# Patient Record
Sex: Female | Born: 1995 | Hispanic: No | Marital: Married | Smoking: Never smoker
Health system: Southern US, Community
[De-identification: ages and names within clinical notes are randomized; demographics above are authoritative.]

---

## 2020-05-13 ENCOUNTER — Encounter (HOSPITAL_COMMUNITY): Payer: Self-pay

## 2020-05-13 ENCOUNTER — Other Ambulatory Visit: Payer: Self-pay

## 2020-05-13 ENCOUNTER — Ambulatory Visit (HOSPITAL_COMMUNITY)
Admission: EM | Admit: 2020-05-13 | Discharge: 2020-05-13 | Disposition: A | Discharge status: Home / Self Care | Payer: Self-pay | Attending: Family Medicine | Admitting: Family Medicine

## 2020-05-13 DIAGNOSIS — N912 Amenorrhea, unspecified: Secondary | ICD-10-CM

## 2020-05-13 DIAGNOSIS — Z3201 Encounter for pregnancy test, result positive: Secondary | ICD-10-CM

## 2020-05-13 DIAGNOSIS — R5383 Other fatigue: Secondary | ICD-10-CM

## 2020-05-13 DIAGNOSIS — Z331 Pregnant state, incidental: Secondary | ICD-10-CM

## 2020-05-13 LAB — POC URINE PREG, ED: Preg Test, Ur: POSITIVE — AB

## 2020-05-13 NOTE — ED Triage Notes (Signed)
Pt reports here for a pregnancy test. Reports feeling tired and missed her period last month.

## 2020-05-13 NOTE — ED Provider Notes (Signed)
[facility]    CSN: [id number] Arrival date & time: 05/13/20  1013      History   Chief Complaint Chief Complaint  Patient presents with  . Possible Pregnancy    Pt reports feeling tired and did not have a period last month.     HPI Mary Shepherd is a 24 y.o. female.   Reports that she has been feeling tired and a little nauseated for the last few days. Reports that she missed her period last month as well. Reports that she is sexually active and does not use birth control. Denies headache, vomiting, diarrhea, abdominal pain, cramping, vaginal bleeding, other symptoms.   ROS per HPI  The history is provided by the patient.  Possible Pregnancy    History reviewed. No pertinent past medical history.  There are no problems to display for this patient.   History reviewed. No pertinent surgical history.  OB History   No obstetric history on file.      Home Medications    Prior to Admission medications   Not on File    Family History History reviewed. No pertinent family history.  Social History Social History   Tobacco Use  . Smoking status: Never Smoker  . Smokeless tobacco: Never Used  Substance Use Topics  . Alcohol use: Never  . Drug use: Never     Allergies   Patient has no allergy information on record.   Review of Systems Review of Systems   Physical Exam Triage Vital Signs ED Triage Vitals  Enc Vitals Group     BP 05/13/20 1121 131/86     Pulse Rate 05/13/20 1121 87     Resp 05/13/20 1121 18     Temp 05/13/20 1121 97.7 F (36.5 C)     Temp Source 05/13/20 1121 Oral     SpO2 05/13/20 1121 99 %     Weight --      Height --      Head Circumference --      Peak Flow --      Pain Score 05/13/20 1106 0     Pain Loc --      Pain Edu? --      Excl. in GC? --    No data found.  Updated Vital Signs BP 131/86 (BP Location: Right Arm)   Pulse 87   Temp 97.7 F (36.5 C) (Oral)   Resp 18   LMP 03/26/2020   SpO2 99%    Visual Acuity Right Eye Distance:   Left Eye Distance:   Bilateral Distance:    Right Eye Near:   Left Eye Near:    Bilateral Near:     Physical Exam Vitals and nursing note reviewed.  Constitutional:      General: She is not in acute distress.    Appearance: Normal appearance. She is well-developed. She is not ill-appearing.  HENT:     Head: Normocephalic and atraumatic.     Nose: Nose normal.     Mouth/Throat:     Mouth: Mucous membranes are moist.     Pharynx: Oropharynx is clear.  Eyes:     Extraocular Movements: Extraocular movements intact.     Conjunctiva/sclera: Conjunctivae normal.     Pupils: Pupils are equal, round, and reactive to light.  Cardiovascular:     Rate and Rhythm: Normal rate and regular rhythm.     Heart sounds: Normal heart sounds. No murmur heard.   Pulmonary:     Effort:  Pulmonary effort is normal. No respiratory distress.     Breath sounds: Normal breath sounds. No stridor. No wheezing, rhonchi or rales.  Chest:     Chest wall: No tenderness.  Abdominal:     General: Bowel sounds are normal. There is no distension.     Palpations: Abdomen is soft. There is no mass.     Tenderness: There is no abdominal tenderness. There is no guarding or rebound.     Hernia: No hernia is present.  Musculoskeletal:        General: Normal range of motion.     Cervical back: Normal range of motion and neck supple.  Skin:    General: Skin is warm and dry.     Capillary Refill: Capillary refill takes less than 2 seconds.  Neurological:     General: No focal deficit present.     Mental Status: She is alert and oriented to person, place, and time.  Psychiatric:        Mood and Affect: Mood normal.        Behavior: Behavior normal.        Thought Content: Thought content normal.      UC Treatments / Results  Labs (all labs ordered are listed, but only abnormal results are displayed) Labs Reviewed  POC URINE PREG, ED - Abnormal; Notable for the  following components:      Result Value   Preg Test, Ur POSITIVE (*)    All other components within normal limits    EKG   Radiology No results found.  Procedures Procedures (including critical care time)  Medications Ordered in UC Medications - No data to display  Initial Impression / Assessment and Plan / UC Course  I have reviewed the triage vital signs and the nursing notes.  Pertinent labs & imaging results that were available during my care of the patient were reviewed by me and considered in my medical decision making (see chart for details).     Amenorrhea Fatigue Positive Pregnancy test Incidental pregnancy  Presents with fatigue and nausea for the last few days Also missed her period last month UPreg positive in office Handouts given on pregnancy and medications that are safe to take Follow up with OB Final Clinical Impressions(s) / UC Diagnoses   Final diagnoses:  Amenorrhea  Other fatigue  Positive pregnancy test  Incidental pregnancy     Discharge Instructions     Your pregnancy test was positive  I have included information about pregnancy and what to expect  I have also included a handout for medications that are safe to take during pregnancy as well  Follow up with [facility]    ED Prescriptions    None     PDMP not reviewed this encounter.   Moshe Cipro, NP 05/13/20 1146

## 2020-05-13 NOTE — Discharge Instructions (Addendum)
Your pregnancy test was positive  I have included information about pregnancy and what to expect  I have also included a handout for medications that are safe to take during pregnancy as well  Follow up with OB

## 2020-07-28 NOTE — L&D Delivery Note (Signed)
Mary Shepherd is a 25 y.o. female G1P0 with IUP at [redacted]w[redacted]d admitted for IOL for A1GDM .  She progressed with FB, cytotec and pitocin augmentation to complete and pushed 20 minutes to deliver.  Cord clamping delayed by several minutes then clamped by CNM and cut by FOb.    Delivery Note At 6:31 PM a viable female was delivered via Vaginal, Spontaneous (Presentation: Left Occiput Anterior).  APGAR: 8, 9; weight pending.   Placenta status: Spontaneous, Intact.  Cord: 3 vessels with the following complications: None.  Anesthesia: None Episiotomy: None Lacerations: 2nd degree peineal; Bilateral Labial Suture Repair: 3.0 vicryl 4.0 monocryl Est. Blood Loss (mL):  350  Mom to postpartum.  Baby to Couplet care / Skin to Skin.  Rolm Bookbinder CNM 12/29/2020, 7:06 PM

## 2020-08-20 ENCOUNTER — Other Ambulatory Visit: Payer: Self-pay

## 2020-08-20 ENCOUNTER — Encounter: Payer: Self-pay | Admitting: Advanced Practice Midwife

## 2020-08-20 ENCOUNTER — Ambulatory Visit (INDEPENDENT_AMBULATORY_CARE_PROVIDER_SITE_OTHER): Payer: 59 | Admitting: Advanced Practice Midwife

## 2020-08-20 ENCOUNTER — Other Ambulatory Visit (HOSPITAL_COMMUNITY)
Admission: RE | Admit: 2020-08-20 | Discharge: 2020-08-20 | Disposition: A | Discharge status: Home / Self Care | Payer: 59 | Source: Ambulatory Visit | Attending: Advanced Practice Midwife | Admitting: Advanced Practice Midwife

## 2020-08-20 VITALS — BP 126/84 | HR 103 | Ht 59.0 in | Wt 130.4 lb

## 2020-08-20 DIAGNOSIS — Z3490 Encounter for supervision of normal pregnancy, unspecified, unspecified trimester: Secondary | ICD-10-CM | POA: Diagnosis present

## 2020-08-20 DIAGNOSIS — Z3A21 21 weeks gestation of pregnancy: Secondary | ICD-10-CM

## 2020-08-20 DIAGNOSIS — Z124 Encounter for screening for malignant neoplasm of cervix: Secondary | ICD-10-CM

## 2020-08-20 DIAGNOSIS — Z3482 Encounter for supervision of other normal pregnancy, second trimester: Secondary | ICD-10-CM

## 2020-08-20 DIAGNOSIS — Z789 Other specified health status: Secondary | ICD-10-CM

## 2020-08-20 NOTE — Progress Notes (Addendum)
Subjective:   Mary Shepherd is a 25 y.o. G1P0 at [redacted]w[redacted]d by LMP being seen today for her first obstetrical visit.  Her obstetrical history is significant for none G1 and has Supervision of normal pregnancy, antepartum on their problem list.. Patient does not intend to breast feed. Pregnancy history fully reviewed.  Patient reports no complaints.  HISTORY: OB History  Gravida Para Term Preterm AB Living  1 0 0 0 0 0  SAB IAB Ectopic Multiple Live Births  0 0 0 0 0    # Outcome Date GA Lbr Len/2nd Weight Sex Delivery Anes PTL Lv  1 Current            History reviewed. No pertinent past medical history. History reviewed. No pertinent surgical history. Family History  Problem Relation Age of Onset  . Healthy Mother   . Healthy Father    Social History   Tobacco Use  . Smoking status: Never Smoker  . Smokeless tobacco: Never Used  Vaping Use  . Vaping Use: Never used  Substance Use Topics  . Alcohol use: Never  . Drug use: Never   No Known Allergies No current outpatient medications on file prior to visit.   No current facility-administered medications on file prior to visit.     Indications for ASA therapy (per uptodate) One of the following: Previous pregnancy with preeclampsia, especially early onset and with an adverse outcome No Multifetal gestation No Chronic hypertension No Type 1 or 2 diabetes mellitus No Chronic kidney disease No Autoimmune disease (antiphospholipid syndrome, systemic lupus erythematosus) No   Two or more of the following: Nulliparity Yes Obesity (body mass index >30 kg/m2) No Family history of preeclampsia in mother or sister No Age ?35 years No Sociodemographic characteristics (African American race, low socioeconomic level) No Personal risk factors (eg, previous pregnancy with low birth weight or small for gestational age infant, previous adverse pregnancy outcome [eg, stillbirth], interval >10 years between pregnancies) No    Indications for early 1 hour GTT (per uptodate)  BMI >25 (>23 in Asian women) AND one of the following BMI 23  Exam   Vitals:   08/20/20 0920 08/20/20 0923  BP: 126/84   Pulse: (!) 103   Weight: 130 lb 6.4 oz (59.1 kg)   Height:  4\' 11"  (1.499 m)      Uterus:     Pelvic Exam: Perineum: no hemorrhoids, normal perineum   Vulva: normal external genitalia, no lesions   Vagina:  normal mucosa, normal discharge   Cervix: no lesions and normal, pap smear done.    Adnexa: normal adnexa and no mass, fullness, tenderness   Bony Pelvis: average  System: General: well-developed, well-nourished female in no acute distress   Breast:  normal appearance, no masses or tenderness   Skin: normal coloration and turgor, no rashes   Neurologic: oriented, normal, negative, normal mood   Extremities: normal strength, tone, and muscle mass, ROM of all joints is normal   HEENT PERRLA, extraocular movement intact and sclera clear, anicteric   Mouth/Teeth mucous membranes moist, pharynx normal without lesions and dental hygiene good   Neck supple and no masses   Cardiovascular: regular rate and rhythm   Respiratory:  no respiratory distress, normal breath sounds   Abdomen: soft, non-tender; bowel sounds normal; no masses,  no organomegaly     Assessment:   Pregnancy: G1P0 Patient Active Problem List   Diagnosis Date Noted  . Supervision of normal pregnancy, antepartum 08/20/2020  Plan:  1. Encounter for supervision of normal pregnancy, antepartum, unspecified gravidity --Anticipatory guidance about next visits/weeks of pregnancy given. --Next visit in 4 weeks   --Anatomy US as soon as possible with MFM  - Culture, OB Urine - Genetic Screening - Cervicovaginal ancillary only( [place]) - CBC/D/Plt+RPR+Rh+ABO+Rub Ab... - AFP, Serum, Open Spina Bifida  2. Screening for cervical cancer  - Cytology - PAP    Initial labs drawn. Continue prenatal vitamins. Discussed and offered  genetic screening options, including Quad screen/AFP, NIPS testing, and option to decline testing. Benefits/risks/alternatives reviewed. Pt aware that anatomy US is form of genetic screening with lower accuracy in detecting trisomies than blood work.  Pt chooses genetic screening today. NIPS: ordered. Ultrasound discussed; fetal anatomic survey: ordered. Problem list reviewed and updated. The nature of [facility] with multiple MDs and other Advanced Practice Providers was explained to patient; also emphasized that residents, students are part of our team. Routine obstetric precautions reviewed. Return in about 4 weeks (around 09/17/2020).   Sharen Counter, CNM 08/20/20 10:19 AM

## 2020-08-20 NOTE — Patient Instructions (Signed)
Obstetrics: Normal and Problem Pregnancies (7th ed., pp. 102-121). [place], [place]: Elsevier."> Textbook of Family Medicine (9th ed., pp. [phone]). [place], [place]: Elsevier [employer].">  Ba tha?ng ??u c?a [place] k? First Trimester of Pregnancy  Ba thng ??u c?a [place] k? b?t ??u vo ngy ??u tin c?a k? kinh cu?i cng c?a qu v? cho ??n cu?i tu?n 12. ?y l thng th? 1 ??n thng th? 3 c?a [place] k?. M?t tu?n sau khi tinh trng th? tinh v?i tr?ng, tr?ng s? c?y vo thnh t? cung v b?t ??u pht tri?n thnh m?t em b. Vo cu?i tu?n 12, t?t c? cc c? quan c?a em b s? ???c hnh thnh v em b s? c kch th??c kho?ng 2-3 in-s?Marland Kitchen Cc thay ??i c? th? trong ba thng ??u c?a [place] k? C? th? c?a qu v? tr?i qua r?t nhi?u thay ??i trong th?i gian mang [place]. Nh?ng thay ??i ny khc nhau v th??ng tr? l?i bnh th??ng sau khi sinh con. Cc thay ??i v? th? ch?t  Qu v? c th? t?ng ho?c gi?m cn.  Ng?c c?a quy? vi? c th? b?t ??u pht tri?n l?n h?n v tr? nn nh?y c?m ?au. M quanh cc nm v (qu?ng v) c?a qu v? c th? tr? nn t?i mu h?n.  Cc ??m ho?c v?t t?i mu (rm m hay m?t n? [place] k?) c th? pht tri?n trn m?t qu v?.  Qu v? c th? c nh?ng thay ??i v? tc. Nh?ng thay ??i ny c th? bao g?m tc dy ln ho?c m?ng ?i ho?c thay ??i v? k?t c?u tc. Cc thay ??i v? s?c kh?e  Qu v? c th? c?m th?y bu?n nn v qu v? c th? nn.  Qu v? c th? b? ? nng.  Qu v? c th? b? ?au ??u.  Qu v? c th? b? to bn.  N??u (l?i) c?a qu v? c th? ch?y mu v c th? nh?y c?m v?i vi?c ?nh r?ng v dng ch? nha khoa. Nh??ng thay ??i khc  Qu v? d? b? m?t m?i.  Quy? vi? c th? ?i ti?u th??ng xuyn h?n.  Kinh nguy?t c?a quy? vi? s? d?ng l?i.  Quy? vi? c th? m?t c?m gic ngon mi?ng.  Quy? vi? co? th? co? c?m gic thm ?n m?t s? lo?i th?c ph?m nh?t ??nh.  Qu v? c th? c nh?ng thay ??i c?m xc t?ng ngy.  Quy? vi? c th? c nh?ng gi?c m? s?ng ??ng v k? l? h?n. Tun th? nh?ng h??ng d?n ny ?  nh: Thu?c  Tun th? cc ch? d?n c?a chuyn gia ch?m [place] s?c kh?e v? vi?c s? d?ng thu?c. M?t s? lo?i thu?c c? th? co? th? an ton ho?c khng an ton [place] dng trong qu trnh [place]. Khng dng b?t k? lo?i thu?c no tr? khi ???c chuyn gia ch?m [place] s?c kh?e ch? d?n.  U?ng vitamin tr??c khi sinh c t nh?t 600 microgram (mcg) axit folic. ?n v u?ng  ?n ch? ?? ?n lnh m?nh bao g?m tri cy t??i v rau c?, ng? c?c nguyn cm, cc ngu?n protein t?t nh? th?t, tr?ng, ??u h? v cc s?n ph?m s?a t bo.  Trnh th?t s?ng v n??c p tri cy, s?a v pho mt ch?a ti?t trng. Nh?ng th?c ph?m ny mang m?m b?nh c th? c h?i cho qu v? v con qu v?.  N?u qu v? c?m th?y bu?n nn ho?c qu v? nn: ? ?n 4 ho?c 5 b?a nh?  m?i ngy thay v 3 b?a l?n. ? C? g?ng ?n m?t vi bnh quy gin soda. ? U?ng ?? l?ng gi?a cc b?a ?n thay v trong b?a ?n.  Qu v? c th? c?n th?c hi?n cc hnh ??ng ny ?? ng?n ng?a ho?c ?i?u tr? to bn: ? U?ng ?? n??c ?? gi? cho n??c ti?u c mu vng nh?t. ? ?n th?c ?n giu ch?t x? nh? ??u, ng? c?c nguyn cm, tri cy t??i v rau. ? H?n ch? cc lo?i th?c ?n giu ch?t bo v ???ng tinh luy?n, ch?ng h?n nh? ?? ?n chin/rn ho?c ?? ng?t. Ho?t ??ng  Ch? t?p th? d?c theo ch? d?n c?a chuyn gia ch?m [place] s?c kh?e. H?u h?t ph? n? ??u c th? ti?p t?c ch? ?? t?p luy?n bnh th??ng c?a h? trong khi mang [place]. C? g??ng t?p th? du?c 30 phu?t m?i nga?y, t nh?t 5 nga?y m?i tu?n.  D?ng t?p th? d?c n?u qu v? b? ?au ho?c co th?t ? b?ng d??i ho?c th?t l?ng.  Trnh t?p th? d?c n?u qu nng ho?c qu ?m ho?c n?u qu v? ? n?i c ?? cao l?n.  Trnh nng v?t n?ng.  N?u qu v? ch?n, quy? vi? c th? quan h? tnh d?c tr? khi chuyn gia ch?m [place] s?c kh?e ni qu v? khng ???c quan h?. Gi?m ?au v gi?m c?m gic kh ch?u  M?c m?t chi?c o ng?c nng ?? v hi?u qu? ?? gi?m tnh tr?ng nh?y c?m ?au.  Ngh? ng?i trong khi nng cao chn c?a qu v? n?u qu v? b? chu?t rt chn ho?c ?au vng th?t  l?ng.  N?u qu v? b? phnh t?nh m?ch (gin t?nh m?ch) ? chn: ? ?i t?t h? tr? theo ch? d?n c?a chuyn gia ch?m [place] s?c kh?e. ? Nng cao chn c?a qu v? trong 15 pht, 3-4 l?n m?t ngy. ? H?n ch? mu?i trong ch? ?? ?n c?a qu v?. An ton  Lun th?t dy an ton khi li xe ho?c ng?i trn xe h?i.  Hy trao ??i v?i chuyn gia ch?m [place] s?c kh?e n?u ai ? l?ng m? qu v? b?ng l?i ni ho?c thn th?.  Hy trao ??i v?i chuyn gia ch?m [place] s?c kh?e n?u qu v? c?m th?y bu?n ho?c c suy ngh? t? lm t?n th??ng. L?i s?ng  Khng s? d?ng b?n t?m n??c nng, phng xng h?i, ho?c nh t?m h?i.  Khng th?t r?a. Khng s? d?ng nu?t b?ng v? sinh ho?c b?ng v? sinh c mi th?m.  Khng s? d?ng thu?c th?o d??c, r??u, ma ty b?t h?p php ho?c thu?c ch?a ???c chuyn gia ch?m [place] s?c kh?e c?a qu v? ch?p thu?n. Ha ch?t trong cc s?n ph?m ny c th? c h?i cho con qu v?.  Khng s? d?ng b?t k? s?n ph?m no c nicotine ho?c thu?c l, ch?ng ha?n nh? thu?c l d?ng ht, thu?c l ?i?n t? v thu?c l d?ng nhai. N?u qu v? c?n gip ?? ?? cai thu?c, hy h?i chuyn gia ch?m [place] s?c kh?e.  Trnh h?p ?i v? sinh c?a mo v ??t v? sinh dnh cho mo. Nh?ng th?? na?y mang vi trng c th? gy ra d? t?t b?m sinh ? tr? v c th? m?t [place] nhi (bo [place]) do s?y [place] ho?c thai ch?t l?u. H??ng d?n chung  Trong cc l?n khm tr??c khi sinh th??ng quy trong ba thng mang thai ??u tin, chuyn gia ch?m [place] s?c kh?e s? ti?n hnh khm th?c  th?, th?c hi?n cc xt nghi?m c?n thi?t v h?i qu v? xem m?i chuy?n ?ang di?n ra nh? th? no. Tun th? theo t?t c? cc l?n ??n khm l?i. ?i?u ny c vai tr quan tr?ng.  Yu c?u gip ?? n?u quy? vi? c nhu c?u t? v?n ho?c nhu c?u dinh d??ng trong th??i gian mang thai. Chuyn gia ch?m [place] s?c kh?e c?a quy? vi? c th? co? l?i khuyn ho?c gi?i thi?u quy? vi? ??n cc chuyn gia ?? ???c gip ?? v? ca?c nhu c?u khc nhau.  ???t l?ch h?n g?p nha s?. ? nh, ?nh r?ng b?ng bn ch?i lng m?m. Dng ch? nha khoa nh?  nhng.  Vi?t ra cu h?i c?a quy? vi?. ?em ca?c cu ho?i ??n ca?c bu?i kha?m tr???c khi sinh. N?i tm ki?m thm thng tin  American Pregnancy Association (Hi?p h?i Mang [place] M?): americanpregnancy.org  [employer] (Hi?p h?i S?n Ph? John Giovanni K?): [email or web address]  Office on [employer] (V?n phng S?c kho? Ph? n?): [email or web address] Hy lin l?c v?i chuyn gia ch?m [place] s?c kh?e n?u qu v? c:  Chng m?t.  S?t.  Co th?t nh? ? vng ch?u, t?c n?ng ? vng ch?u, ?au m ? ? vng b?ng.  Bu?n nn, nn m?a ho?c tiu ch?y ko di 25 gi? ho?c lu h?n.  Kh h? ? m ??o c mi kh ch?u.  ?au khi qu v? ?i ti?u.  Bi?t l ti?p xc v?i m?t b?nh ly nhi?m, ch?ng h?n nh? b?nh th?y ??u, s?i, vi rt [place], HIV, ho?c vim gan. Yu c?u gip ?? ngay l?p t?c n?u qu v?:  C ??m mu ho?c ch?y mu ? m ??o.  B? co th?t ho?c ?au d? d?i ? b?ng.  B? kh th? ho?c ?au ng?c.  C b?t k? lo?i ch?n th??ng na?o, ch?ng h?n nh? do ng ho?c tai n?n xe h?i.  C?n ?au m?i ho?c ?au t?ng ln, s?ng ho?c ?? ? m?t cnh tay ho?c chn. Tm t?t  Ba thng ??u c?a [place] k? b?t ??u vo ngy ??u tin c?a k? kinh nguy?t cu?i cng c?a qu v? cho ??n cu?i tu?n 12 (thng th? 1 ??n thng th? 3).  ?n 4 ho?c 5 b?a ?n nh? thay v 3 b?a l?n m?i ngy c th? gip lm gi?m bu?n nn v nn.  Khng s? d?ng b?t k? s?n ph?m no c nicotine ho?c thu?c l, ch?ng ha?n nh? thu?c l d?ng ht, thu?c l ?i?n t? v thu?c l d?ng nhai. N?u qu v? c?n gip ?? ?? cai thu?c, hy h?i chuyn gia ch?m [place] s?c kh?e.  Tun th? theo t?t c? cc l?n ??n khm l?i. ?i?u ny c vai tr quan tr?ng. Thng tin ny khng nh?m m?c ?ch thay th? cho l?i khuyn m chuyn gia ch?m [place] s?c kh?e ni v?i qu v?. Hy b?o ??m qu v? ph?i th?o lu?n b?t k? v?n ?? g m qu v? c v?i chuyn gia ch?m [place] s?c kh?e c?a qu v?. Document Revised: 12/29/2019 Document Reviewed: 12/29/2019 Elsevier Patient Education   2021 [employer].

## 2020-08-20 NOTE — Progress Notes (Signed)
NOB first pregnancy husband is present.    Genetic Screening : Desired   AFP  Last pap: Never   CC: None   Pt only has concerns since this is her first pregnancy she wants to discuss what to expect.

## 2020-08-21 LAB — CERVICOVAGINAL ANCILLARY ONLY
Bacterial Vaginitis (gardnerella): POSITIVE — AB
Candida Glabrata: NEGATIVE
Candida Vaginitis: NEGATIVE
Chlamydia: NEGATIVE
Comment: NEGATIVE
Comment: NEGATIVE
Comment: NEGATIVE
Comment: NEGATIVE
Comment: NEGATIVE
Comment: NORMAL
Neisseria Gonorrhea: NEGATIVE
Trichomonas: NEGATIVE

## 2020-08-21 LAB — CYTOLOGY - PAP: Diagnosis: NEGATIVE

## 2020-08-22 LAB — AFP, SERUM, OPEN SPINA BIFIDA
AFP MoM: 1.47
AFP Value: 105.6 ng/mL
Gest. Age on Collection Date: 21 weeks
Maternal Age At EDD: 23.5 yr
OSBR Risk 1 IN: 2965
Test Results:: NEGATIVE
Weight: 130 [lb_av]

## 2020-08-22 LAB — CBC/D/PLT+RPR+RH+ABO+RUB AB...
Antibody Screen: NEGATIVE
Basophils Absolute: 0 10*3/uL (ref 0.0–0.2)
Basos: 0 %
EOS (ABSOLUTE): 0.4 10*3/uL (ref 0.0–0.4)
Eos: 3 %
HCV Ab: <0.1 s/co ratio (ref 0.0–0.9)
HIV Screen 4th Generation wRfx: NONREACTIVE
Hematocrit: 34.7 % (ref 34.0–46.6)
Hemoglobin: 10.9 g/dL — ABNORMAL LOW (ref 11.1–15.9)
Hepatitis B Surface Ag: NEGATIVE
Immature Grans (Abs): 0.1 10*3/uL (ref 0.0–0.1)
Immature Granulocytes: 1 %
Lymphocytes Absolute: 1.5 10*3/uL (ref 0.7–3.1)
Lymphs: 14 %
MCH: 23.9 pg — ABNORMAL LOW (ref 26.6–33.0)
MCHC: 31.4 g/dL — ABNORMAL LOW (ref 31.5–35.7)
MCV: 76 fL — ABNORMAL LOW (ref 79–97)
Monocytes Absolute: 0.5 10*3/uL (ref 0.1–0.9)
Monocytes: 5 %
Neutrophils Absolute: 8.3 10*3/uL — ABNORMAL HIGH (ref 1.4–7.0)
Neutrophils: 77 %
Platelets: 263 10*3/uL (ref 150–450)
RBC: 4.56 x10E6/uL (ref 3.77–5.28)
RDW: 13.7 % (ref 11.7–15.4)
RPR Ser Ql: NONREACTIVE
Rh Factor: POSITIVE
Rubella Antibodies, IGG: 30.5 index (ref >0.99)
WBC: 10.8 10*3/uL (ref 3.4–10.8)

## 2020-08-22 LAB — HCV INTERPRETATION

## 2020-08-22 LAB — CULTURE, OB URINE

## 2020-08-22 LAB — URINE CULTURE, OB REFLEX

## 2020-08-28 ENCOUNTER — Encounter: Payer: Self-pay | Admitting: Advanced Practice Midwife

## 2020-08-31 ENCOUNTER — Ambulatory Visit: Payer: 59 | Attending: Advanced Practice Midwife

## 2020-08-31 ENCOUNTER — Other Ambulatory Visit: Payer: Self-pay

## 2020-08-31 DIAGNOSIS — Z3490 Encounter for supervision of normal pregnancy, unspecified, unspecified trimester: Secondary | ICD-10-CM | POA: Insufficient documentation

## 2020-08-31 DIAGNOSIS — Z3A21 21 weeks gestation of pregnancy: Secondary | ICD-10-CM | POA: Insufficient documentation

## 2020-09-17 ENCOUNTER — Ambulatory Visit (INDEPENDENT_AMBULATORY_CARE_PROVIDER_SITE_OTHER): Payer: 59 | Admitting: Advanced Practice Midwife

## 2020-09-17 ENCOUNTER — Other Ambulatory Visit: Payer: Self-pay

## 2020-09-17 ENCOUNTER — Encounter: Payer: Self-pay | Admitting: Advanced Practice Midwife

## 2020-09-17 VITALS — BP 110/72 | HR 90 | Wt 133.0 lb

## 2020-09-17 DIAGNOSIS — Z789 Other specified health status: Secondary | ICD-10-CM

## 2020-09-17 DIAGNOSIS — D563 Thalassemia minor: Secondary | ICD-10-CM

## 2020-09-17 DIAGNOSIS — Z3490 Encounter for supervision of normal pregnancy, unspecified, unspecified trimester: Secondary | ICD-10-CM

## 2020-09-17 DIAGNOSIS — Z3A25 25 weeks gestation of pregnancy: Secondary | ICD-10-CM

## 2020-09-17 NOTE — Patient Instructions (Signed)
Ba thng th? hai c?a [place] k? Second Trimester of Pregnancy  Ba thng th? hai c?a thai k? l t? tu?n 13 ??n tu?n 27. ?y l thng th? 4 ??n thng th? 6 c?a [place] k?Oley Balm th? hai th??ng l th?i gian m qu v? c?m th?y tho?i mi nh?t. C? th? c?a qu v? c?ng ? ?i?u ch?nh ?? ph h?p v?i vi?c mang thai v qu v? b?t ??u c?m th?y ?n h?n v? m?t th? ch?t. Trong ba thng th? hai:  ?m nghn ? gi?m ho?c h?t hon ton.  Qu v? c th? c nhi?u n?ng l??ng h?n.  Qu v? c th? t?ng c?m gic ngon mi?ng. Ba thng th? hai c?ng l th?i gian khi em b ch?a sinh (bo [place]) ?ang l?n ln nhanh chng. Vo cu?i thng th? su, bo [place] c th? di kho?ng 12 in s? v n?ng kho?ng 1 pao. Qu v? c kh? n?ng s? b?t ??u c?m th?y em b c? ??ng (thai ??p l?n ??u) trong kho?n tu?n 16 ??n tu?n 20 c?a [place] k?. Cc thay ??i c? th? trong ba thng th? hai c?a [place] k? C? th? qu v? b?t ??u tr?i qua nhi?u thay ??i trong ba thng th? hai c?a [place] k?. Nh?ng thay ??i ny khc nhau v th??ng tr? l?i bnh th??ng sau khi sinh. Cc thay ??i v? th? ch?t  Cn n?ng c?a qu v? s? ti?p t?c t?ng. Qu v? s? nh?n th?y b?ng ph?ng to ra.  Qu v? c th? b?t ??u c cc v?t r?n da trn hng, b?ng v v.  Ng?c qu v? s? ti?p t?c pht tri?n v nh?y c?m ?au.  Cc ??m ho?c v?t t?i mu (rm m hay m?t n? [place] k?) c th? pht tri?n trn m?t qu v?.  M?t ???ng s?m mu ch?y t? r?n ??n vng lng mu (???ng linea nigra) c th? xu?t hi?n.  Qu v? c th? c nh?ng thay ??i v? tc. Nh?ng thay ??i ny c th? bao g?m tc dy ln, tc m?c nhanh h?n v thay ??i v? k?t c?u tc. M?t s? ng??i c?ng b? r?ng tc trong khi ho?c sau khi mang thai, ho?c c?m th?y tc kh ho?c m?ng. Cc thay ??i v? s?c kh?e  Qu v? c th? b? ?au ??u.  Qu v? c th? b? ? nng.  Qu v? c th? b? to bn.  Qu v? c th? b? tr? ho?c s?ng, phnh t?nh m?ch (gin t?nh m?ch).  N??u (l?i) c?a qu v? c th? ch?y mu v c th? nh?y c?m v?i vi?c ?nh r?ng v dng ch? nha khoa.  Qu v? c th?  ?i ti?u th??ng xuyn h?n v bo thai p vo bng quang c?a qu v?.  Quy? vi? c th? bi? ?au l?ng. V?n ?? ny l do: ? T?ng cn. ? Cc hc mn thai k? lm th? gin cc kh?p ? khung ch?u c?a qu v?. ? Thay ??i v? cn n?ng v cc c? h? tr? th?ng b?ng c?a qu v?. Tun th? nh?ng h??ng d?n ny ? nh: Thu?c  Tun th? cc ch? d?n c?a chuyn gia ch?m [place] s?c kh?e v? vi?c s? d?ng thu?c. M?t s? lo?i thu?c c? th? co? th? an ton ho?c khng an ton [place] dng trong qu trnh [place]. Khng dng b?t k? lo?i thu?c no tr? khi ???c chuyn gia ch?m [place] s?c kh?e c?a qu v? ch?p thu?n.  U?ng vitamin tr??c khi sinh c t nh?t 600 microgram (  mcg) axit folic. ?n v u?ng  ?n ch? ?? ?n lnh m?nh bao g?m tri cy t??i v rau c?, ng? c?c nguyn cm, cc ngu?n protein t?t nh? th?t, tr?ng, ??u h? v cc s?n ph?m s?a t bo.  Trnh th?t s?ng v n??c p tri cy, s?a v pho mt ch?a ti?t trng. Nh?ng th?c ph?m ny mang m?m b?nh c th? c h?i cho qu v? v con qu v?.  Qu v? c th? c?n th?c hi?n cc hnh ??ng ny ?? ng?n ng?a ho?c ?i?u tr? to bn: ? U?ng ?? n??c ?? gi? cho n??c ti?u c mu vng nh?t. ? ?n th?c ?n giu ch?t x? nh? ??u, ng? c?c nguyn cm, tri cy t??i v rau. ? H?n ch? cc lo?i th?c ?n giu ch?t bo v ???ng tinh luy?n, ch?ng h?n nh? ?? ?n chin/rn ho?c ?? ng?t. Ho?t ??ng  Ch? t?p th? d?c theo ch? d?n c?a chuyn gia ch?m [place] s?c kh?e. H?u h?t ph? n? ??u c th? ti?p t?c ch? ?? t?p luy?n bnh th??ng c?a h? trong khi mang [place]. C? g??ng t?p th? du?c 30 phu?t m?i nga?y, t nh?t 5 nga?y m?i tu?n. Ng?ng t?p th? d?c n?u qu v? c cc c?n co th?t trong t? cung.  D?ng t?p th? d?c n?u qu v? b? ?au ho?c co th?t ? b?ng d??i ho?c th?t l?ng.  Trnh t?p th? d?c n?u qu nng ho?c qu ?m, ho?c n?u qu v? ? n?i c ?? cao l?n.  Trnh nng v?t n?ng.  N?u qu v? ch?n, quy? vi? c th? quan h? tnh d?c tr? khi chuyn gia ch?m [place] s?c kh?e ni qu v? khng ???c quan h?. Gi?m ?au v gi?m c?m gic kh  ch?u  M?c m?t chi?c o ng?c nng ?? v ?? ng?n ng?a c?m gic kh ch?u do v b? nh?y c?m ?au.  T?m b?n ng?i n??c ?m ?? lm d?u ?au ho?c d?u c?m gic kh ch?u do b?nh tr? gy ra. S? d?ng kem ?i?u tr? b?nh tr? n?u chuyn gia ch?m [place] s?c kh?e ch?p thu?n.  Ngh? ng?i trong khi nng caochn c?a qu v? n?u qu v? b? chu?t rt chn ho?c ?au vng th?t l?ng.  N?u qu v? b? gin t?nh m?ch: ? ?i t?t h? tr? theo ch? d?n c?a chuyn gia ch?m [place] s?c kh?e. ? Nng cao chn c?a qu v? trong 15 pht, 3-4 l?n m?t ngy. ? H?n ch? mu?i trong ch? ?? ?n c?a qu v?. An ton  Lun th?t dy an ton khi li xe ho?c ng?i trn xe h?i.  Hy trao ??i v?i chuyn gia ch?m [place] s?c kh?e n?u ai ? l?ng m? qu v? b?ng l?i ni ho?c thn th?. L?i s?ng  Khng s? d?ng b?n t?m n??c nng, phng xng h?i, ho?c nh t?m h?i.  Khng th?t r?a. Khng s? d?ng nu?t b?ng v? sinh ho?c b?ng v? sinh c mi th?m.  Trnh h?p ?i v? sinh c?a mo v ??t v? sinh dnh cho mo. Nh?ng th?? na?y mang m?m b?nh c th? gy ra d? t?t b?m sinh ? tr? v c th? m?t [place] nhi do s?y [place] ho?c thai ch?t l?u.  Khng s? d?ng thu?c th?o d??c, r??u, ma ty b?t h?p php ho?c thu?c ch?a ???c chuyn gia ch?m  s?c kh?e c?a qu v? ch?p thu?n. Ha ch?t trong cc s?n ph?m ny c th? c h?i cho con qu v?.  Khng s? d?ng b?t k? s?n ph?m no c  nicotine ho?c thu?c l, ch?ng ha?n nh? thu?c l d?ng ht, thu?c l ?i?n t? v thu?c l d?ng nhai. N?u qu v? c?n gip ?? ?? cai thu?c, hy h?i chuyn gia ch?m [place] s?c kh?e. H??ng d?n chung  Trong m?t l?n th?m khm tr??c khi sinh th??ng quy, chuyn gia ch?m [place] s?c kh?e s? ti?n hnh khm th?c th? v lm cc xt nghi?m khc. Ng??i ? c?ng s? th?o lu?n v? s?c kh?e t?ng th? c?a qu v?. Tun th? theo t?t c? cc l?n ??n khm l?i. ?i?u ny c vai tr quan tr?ng.  H?i chuyn gia ch?m [place] s?c kh?e c?a quy? vi? ?? ???c gi?i thi?u ??n m?t l?p h?c tr???c khi sinh t?i ??a ph??ng.  Yu c?u gip ?? n?u quy? vi? c nhu c?u t? v?n ho?c nhu  c?u dinh d??ng trong th??i gian mang [place]. Chuyn gia ch?m [place] s?c kh?e c?a quy? vi? c th? co? l?i khuyn ho?c gi?i thi?u quy? vi? ??n cc chuyn gia ?? ???c gip ?? v? ca?c nhu c?u khc nhau. N?i tm ki?m thm thng tin  American Pregnancy Association (Hi?p h?i Mang [place] M?): americanpregnancy.org  [employer] (Hi?p h?i S?n Ph? Amanda Cockayne K?): [email or web address]  Office on [employer] (V?n phng S?c kho? Ph? n?): [email or web address] Hy lin l?c v?i chuyn gia ch?m [place] s?c kh?e n?u qu v? c:  ?au ??u khng h?t sau khi dng thu?c.  Thay ??i th? l?c ho?c qu v? nhn th?y cc ??m ? pha tr??c m?t.  Co th?t nh? ? vng ch?u, t?c n?ng ? vng ch?u, ?au m ? ? vng b?ng.  Bu?n nn, nn m?a ho?c tiu ch?y dai d?ng.  Kh h? ? m ??o c mi hi ho?c n??c ti?u mi hi.  ?au khi qu v? ?i ti?u.  S?ng ??t ng?t ho?c r?t nhi?u ? m?t, bn tay, c? chn, bn chn ho?c chn.  S?t. Yu c?u tr? gip ngay l?p t?c n?u qu v?:  B? r? d?ch ? m ??o.  C ra ??m mu ho?c ch?y mu ? m ??o.  B? co th?t ho?c ?au r?t nhi?u ? b?ng.  Kh th?.  B? ?au ng?c.  B? ng?t x?u.  Khng c?m th?y em b c?? ??ng trong kho?ng th??i gian m chuyn gia ch?m [place] s??c kh?e ch? d?n.  B? ?au m?i ho?c ?au t?ng ln, s?ng ho?c t?y ?? ? m?t cnh tay ho?c chn. Tm t?t  Ba thng th? hai c?a [place] k? l t? tu?n 13 ??n tu?n 27 (thng th? 4 ??n th? thng th? 6).  Khng s? d?ng thu?c th?o d??c, r??u, ma ty b?t h?p php ho?c thu?c ch?a ???c chuyn gia ch?m [place] s?c kh?e c?a qu v? ch?p thu?n. Ha ch?t trong cc s?n ph?m ny c th? c h?i cho con qu v?.  Ch? t?p th? d?c theo ch? d?n c?a chuyn gia ch?m [place] s?c kh?e. H?u h?t ph? n? ??u c th? ti?p t?c ch? ?? t?p luy?n bnh th??ng c?a h? trong khi mang [place].  Tun th? theo t?t c? cc l?n ??n khm l?i. ?i?u ny c vai tr quan tr?ng. Thng tin ny khng nh?m m?c ?ch thay th? cho l?i khuyn m chuyn gia ch?m  [place] s?c kh?e ni v?i qu v?. Hy b?o ??m qu v? ph?i th?o lu?n b?t k? v?n ?? g m qu v? c v?i chuyn gia ch?m  s?c kh?e c?a qu v?. Document Revised: 12/29/2019 Document Reviewed: 12/29/2019 Elsevier  Elsevier Patient Education  2021 Elsevier Inc.   

## 2020-09-17 NOTE — Progress Notes (Signed)
Advised pt to get OTC prenatal vitamin.

## 2020-09-17 NOTE — Progress Notes (Signed)
   PRENATAL VISIT NOTE  Subjective:  Mary Shepherd is a 25 y.o. G1P0 at [redacted]w[redacted]d being seen today for ongoing prenatal care.  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  Patient reports no complaints.  Contractions: Not present. Vag. Bleeding: None.  Movement: Present. Denies leaking of fluid.   The following portions of the patient's history were reviewed and updated as appropriate: allergies, current medications, past family history, past medical history, past social history, past surgical history and problem list.   Objective:   Vitals:   09/17/20 0946  BP: 110/72  Pulse: 90  Weight: 133 lb (60.3 kg)    Fetal Status: Fetal Heart Rate (bpm): 150   Movement: Present     General:  Alert, oriented and cooperative. Patient is in no acute distress.  Skin: Skin is warm and dry. No rash noted.   Cardiovascular: Normal heart rate noted  Respiratory: Normal respiratory effort, no problems with respiration noted  Abdomen: Soft, gravid, appropriate for gestational age.  Pain/Pressure: Absent     Pelvic: Cervical exam deferred        Extremities: Normal range of motion.     Mental Status: Normal mood and affect. Normal behavior. Normal judgment and thought content.   Assessment and Plan:  Pregnancy: G1P0 at [redacted]w[redacted]d  1. Encounter for supervision of normal pregnancy, antepartum, unspecified gravidity --Anticipatory guidance about next visits/weeks of pregnancy given. --next visit in 4 weeks in office for GTT  2. Language barrier affecting health care --[place] interpreter used for all communication  3. Thalassemia alpha carrier  4. [redacted] weeks gestation of pregnancy   Preterm labor symptoms and general obstetric precautions including but not limited to vaginal bleeding, contractions, leaking of fluid and fetal movement were reviewed in detail with the  patient. Please refer to After Visit Summary for other counseling recommendations.   Return in about 4 weeks (around 10/15/2020).  Future Appointments  Date Time Provider Department Center  10/16/2020  8:15 AM [facility] None  10/16/2020  8:30 AM Marny Lowenstein, PA-C [facility] None    Sharen Counter, CNM

## 2020-10-16 ENCOUNTER — Ambulatory Visit (INDEPENDENT_AMBULATORY_CARE_PROVIDER_SITE_OTHER): Payer: 59 | Admitting: Medical

## 2020-10-16 ENCOUNTER — Other Ambulatory Visit: Payer: Self-pay

## 2020-10-16 ENCOUNTER — Other Ambulatory Visit: Payer: 59

## 2020-10-16 VITALS — BP 115/80 | HR 103 | Wt 135.0 lb

## 2020-10-16 DIAGNOSIS — Z3A29 29 weeks gestation of pregnancy: Secondary | ICD-10-CM

## 2020-10-16 DIAGNOSIS — D563 Thalassemia minor: Secondary | ICD-10-CM

## 2020-10-16 DIAGNOSIS — Z3490 Encounter for supervision of normal pregnancy, unspecified, unspecified trimester: Secondary | ICD-10-CM

## 2020-10-16 NOTE — Patient Instructions (Addendum)
Rosen's Emergency Medicine: Concepts and Clinical Practice (9th ed., pp. 2296- 2312). Elsevier.">  C?n co th?t Braxton Hicks Braxton Hicks Contractions Cc c?n co th?t t? cung c th? x?y ra trong su?t [place] k?, nh?ng khng ph?i lc no c?ng l d?u hi?u cho th?y qu v? ?ang chuy?n d?. Qu v? c th? c cc c?n co th?t th?c t?p ???c g?i l c?n co th?t [employer]. Nh?ng c?n co th?t chuy?n d? gi? ny ?i khi b? nh?m v?i chuy?n d? th?t. C?n co th?t Braxton Hicks l g? C?n co th?t Braxton Hicks l nh?ng chuy?n ??ng co th?t x?y ra trong c? t? cung tr??c khi chuy?n d?. Khng gi?ng cc c?n co th?t chuy?n d? th?t, nh?ng c?n co th?t ny khng lm m? (gin n?) v lm m?ng c? t? cung. V? cu?i [place] k? (32-34 tu?n), cc c?n co th?t Braxton Hicks c th? x?y ra th??ng xuyn h?n v c th? tr? ln m?nh h?n. ?i khi, nh?ng c?n co th?t ny kh phn bi?t v?i chuy?n d? th?t b?i v n c th? r?t kh ch?u. Qu v? ??ng c?m th?y ng??ng ngng n?u qu v? vo b?nh vi?n khi c chuy?n d? gi?. ?i khi, cch duy nh?t ?? bi?t qu v? c chuy?n d? th?t hay khng l ph?i ?? chuyn gia ch?m [place] s?c kh?e tm ki?m nh?ng thay ??i ? c? t? cung. Chuyn gia ch?m [place] s?c kh?e s? khm th?c th? v c th? theo di cc c?n co th?t c?a qu v?. N?u qu v? khng ph?i ?ang chuy?n d? th?t, vi?c th?m khm s? cho th?y c? t? cung c?a qu v? ?ang khng gin n? v ?i ch?a v?. N?u khng c v?n ?? s?c kh?e no khc lin quan ??n [place] k?, ?? qu v? ra v? trong tr??ng h?p chuy?n d? gi? l hon ton an ton [employer] v?. Qu v? c th? ti?p t?c c cc c?n co th?t Braxton Hicks cho ??n khi qu v? chuy?n d? th?t. Cch phn bi?t gi?a chuy?n d? th?t v chuy?n d? gi? Chuy?n d? th?t  Cc c?n co th?t ko di 30-70 giy.  Cc c?n co th?t tr? ln r?t ??u ??n.  C?m gic kh ch?u th??ng c?m th?y ? ??nh t? cung v lan t?i vng b?ng d??i v vng th?t l?ng.  Cc c?n co th?t khng h?t khi qu v? ?i l?i.  Cc c?n co th?t th??ng tr? ln m?nh h?n v t?ng t?n su?t.  C? t? cung  gin n? v m?ng h?n. Chuy?n d? gi?  Cc c?n co th?t th??ng ng?n h?n v khng m?nh nh? c?n co th?t chuy?n d? th?t.  Cc c?n co th?t th??ng khng ??u.  Cc c?n co th?t th??ng ???c c?m nh?n ? ph?n tr??c b?ng d??i v ? b?n.  Cc c?n co th?t c th? h?t khi qu v? ?i l?i ho?c thay ??i t? th? khi n?m.  Cc c?n co th?t tr? ln y?u h?n v ng?n h?n theo th?i gian.  C? t? cung th??ng khng gin ra ho?c m?ng ?i. Tun th? nh?ng h??ng d?n ny ? nh:  Ch? s? d?ng thu?c khng k ??n v thu?c k ??n theo ch? d?n c?a chuyn gia ch?m [place] s?c kh?e.  Ti?p t?c cc bi th? d?c thng th??ng c?a qu v? v lm theo cc ch? d?n khc c?a chuyn gia ch?m  s?c kh?e.  ?n v u?ng ?? nh? n?u qu v? ngh? qu v? s?p chuy?n d?.  N?u  cc c?n co th?t Braxton Hicks lm qu v? kh ch?u: ? Thay ??i t? t? th? n?m ho?c ngh? ng?i sang ?i l?i, ho?c t? ?i l?i sang ngh? ng?i. ? Ng?i v ngh? ng?i trong m?t b?n n??c ?m. ? U?ng ?? n??c ?? gi? cho n??c ti?u c mu vng nh?t. M?t n??c c th? gy ra nh?ng c?n co th?t nh? v?y. ? Th? ch?m v su vi l?n trong m?t ti?ng.  Tun th? t?t c? cc l?n khm theo di tr??c khi sinh theo ch? d?n c?a chuyn gia ch?m [place] s?c kh?e. ?i?u ny c vai tr quan tr?ng.   Hy lin l?c v?i chuyn gia ch?m [place] s?c kh?e n?u:  Qu v? b? s?t.  Qu v? ti?p t?c b? ?au ? b?ng. Yu c?u tr? gip ngay l?p t?c n?u:  Cc c?n co th?t tr? nn m?nh h?n, ??u h?n v g?n nhau h?n.  Qu v? b? r? d?ch ho?c ti?t d?ch ? m ??o.  Qu v? ra d?ch nh?y c l?n mu (nh?t mu).  Qu v? b? ch?y mu ? m ??o.  Qu v? b? ?au vng th?t l?ng m tr??c ?y ch?a t?ng b? bao gi?Ladell Heads v? c?m th?y ??u c?a con qu v? ??y xu?ng d??i v gy c?m gic ? n?ng ln vng khung ch?u.  Con qu v? khng c? ??ng bn trong qu v? nhi?u nh? tr??c. Tm t?t  C?n co th?t x?y ra tr??c khi chuy?n d? ???c g?i l c?n co th?t Braxton Hicks, chuy?n d? gi?, ho?c cc c?n co th?t th?c t?p.  Cc c?n co th?t Braxton Hicks th??ng ng?n h?n, y?u h?n,  cch xa h?n v t th??ng xuyn h?n cc c?n co th?t chuy?n d? th?t. Cc c?n co th?t chuy?n d? th?t th??ng tr? ln m?nh d?n ln v ??u ??n, ??ng th?i tr? ln th??ng xuyn h?n.  X? tr c?m gic kh ch?u do cc c?n co th?t Braxton Hicks gy ra b?ng cch thay ??i t? th?, ngh? ng?i trong b?n t?m n??c ?m, u?ng nhi?u n??c, ho?c t?p th? su. Thng tin ny khng nh?m m?c ?ch thay th? cho l?i khuyn m chuyn gia ch?m [place] s?c kh?e ni v?i qu v?. Hy b?o ??m qu v? ph?i th?o lu?n b?t k? v?n ?? g m qu v? c v?i chuyn gia ch?m [place] s?c kh?e c?a qu v?. Document Revised: 10/23/2017 Document Reviewed: 01/01/2017 Elsevier Patient Education  2021 Elsevier Inc. S? l?n c? ??ng c?a [place] nhi Fetal Movement Counts Tn b?nh nhn: ________________________________________________ Ngy d? sinh c?a b?nh nhn: ____________________  S? l?n c? ??ng c?a [place] nhi l g? S? l?n c? ??ng c?a [place] nhi l s? l?n qu v? c?m th?y con qu v? c? ??ng trong m?t kho?ng th?i gian nh?t ??nh. ?y cn ???c g?i l s? l?n [place] nhi ??p. S? l?n c? ??ng c?a [place] nhi ???c khuy?n ngh? cho m?i ph? n? mang thai. Qu v? c th? ???c yu c?u b?t ??u ??m s? l?n c? ??ng c?a [place] nhi ngay t? tu?n 28 c?a [place] k?Nicki Reaper  ??n th?i ?i?m con qu v? ho?t ??ng tch c?c nh?t. Qu v? c th? nh?n th?y chu k? ng? v th?c d?y c?a con mnh. Qu v? c?ng c th? nh?n th?y nh?ng y?u t? khi?n con mnh c? ??ng nhi?u h?n. Qu v? nn ??m s? l?n c? ??ng c?a [place] nhi:  Khi no con qu v? th??ng ho?t ??ng tch c?c nh?t.  Cng m?t th?i ?  i?m m?i ngy. Th?i ?i?m t?t ?? ??m s? l?n c? ??ng l trong khi qu v? ngh? ng?i, sau khi ?n ho?c u?ng th? g ?. Ti c th? ??m s? l?n c? ??ng c?a [place] nhi nh? th? no? 1. Tm m?t khu v?c yn t?nh, tho?i mi. Ng?i, ho?c n?m nghing. 2. Ghi l?i ngy, th?i gian b?t ??u v th?i gian d?ng v s? l?n c? ??ng qu v? c?m th?y gi?a hai th?i ?i?m ?Marland Kitchen Mang theo thng tin ny khi qu v? vo cc l?n khm ch?m [place] s?c kh?e. 3. Ghi l?i th?i gian b?t ??u khi  qu v? c?m nh?n c? ??ng ??u tin. 4. ??m s? l?n ??p, rung, ti?ng s?t so?t, l?n v ??p m?nh. Qu v? c?n c?m th?y t nh?t 10 c? ??ng. 5. Qu v? c th? ng?ng ??m sau khi c?m nh?n ???c 10 c? ??ng, ho?c n?u qu v? ? ??m trong 2 gi?Marland Kitchen Hy ghi l?i th?i gian d?ng. 6. N?u qu v? khng c?m nh?n ???c 10 c? ??ng trong 2 gi?, hy lin h? v?i chuyn gia ch?m [place] s?c kh?e ?? ???c h??ng d?n thm. Chuyn gia ch?m [place] s?c kh?e c th? mu?n lm thm cc xt nghi?m ?? ?nh gi tnh tr?ng s?c kh?e c?a con qu v?. Hy lin l?c v?i chuyn gia ch?m [place] s?c kh?e n?u:  Qu v? c?m th?y d??i 10 c? ??ng trong 2 gi?Marland Kitchen  Con qu v? khng c? ??ng nh? tr? th??ng lm. Ngy: ____________ [place] Char b?t ??u: ____________ Th?i gian d?ng: ____________ S? l?n c? ??ng: ____________ Ngy: ____________ [place] Char b?t ??u: ____________ Th?i gian d?ng: ____________ S? l?n c? ??ng: ____________ Ngy: ____________ [place] b?t ??u: ____________ Th?i gian d?ng: ____________ S? l?n c? ??ng: ____________ Ngy: ____________ [place] b?t ??u: ____________ Th?i gian d?ng: ____________ S? l?n c? ??ng: ____________ Ngy: ____________ [place] Char b?t ??u: ____________ Th?i gian d?ng: ____________ S? l?n c? ??ng: ____________ Ngy: ____________ Wallie Char b?t ??u: ____________ Th?i gian d?ng: ____________ S? l?n c? ??ng: ____________ Ngy: ____________ Wallie Char b?t ??u: ____________ Th?i gian d?ng: ____________ S? l?n c? ??ng: ____________ Ngy: ____________ Wallie Char b?t ??u: ____________ Th?i gian d?ng: ____________ S? l?n c? ??ng: ____________ Ngy: ____________ Wallie Char b?t ??u: ____________ Th?i gian d?ng: ____________ S? l?n c? ??ng: ____________ Gery Pray tin ny khng nh?m m?c ?ch thay th? cho l?i khuyn m chuyn gia ch?m Sabina s?c kh?e ni v?i qu v?. Hy b?o ??m qu v? ph?i th?o lu?n b?t k? v?n ?? g m qu v? c v?i chuyn gia ch?m  s?c kh?e c?a qu v?. Document Revised: 03/29/2019 Document Reviewed: 03/29/2019 Elsevier Patient Education  2021  [employer].

## 2020-10-16 NOTE — Progress Notes (Addendum)
Interpreter: Tobi Bastos #[id number]  ROB/GTT.  Declined TDAP and FLU Vaccines.  Reports no problems today.

## 2020-10-16 NOTE — Progress Notes (Signed)
   PRENATAL VISIT NOTE  Subjective:  Mary Shepherd is a 25 y.o. G1P0 at [redacted]w[redacted]d being seen today for ongoing prenatal care.  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  Patient reports no complaints.  Contractions: Not present. Vag. Bleeding: None.  Movement: Present. Denies leaking of fluid.   The following portions of the patient's history were reviewed and updated as appropriate: allergies, current medications, past family history, past medical history, past social history, past surgical history and problem list.   Objective:   Vitals:   10/16/20 0828  BP: 115/80  Pulse: (!) 103  Weight: 135 lb (61.2 kg)    Fetal Status: Fetal Heart Rate (bpm): 153 Fundal Height: 28 cm Movement: Present     General:  Alert, oriented and cooperative. Patient is in no acute distress.  Skin: Skin is warm and dry. No rash noted.   Cardiovascular: Normal heart rate noted  Respiratory: Normal respiratory effort, no problems with respiration noted  Abdomen: Soft, gravid, appropriate for gestational age.  Pain/Pressure: Absent     Pelvic: Cervical exam deferred        Extremities: Normal range of motion.  Edema: None  Mental Status: Normal mood and affect. Normal behavior. Normal judgment and thought content.   Assessment and Plan:  Pregnancy: G1P0 at [redacted]w[redacted]d 1. Encounter for supervision of normal pregnancy, antepartum, unspecified gravidity - Glucose Tolerance, 2 Hours w/1 Hour - CBC - HIV antibody (with reflex) - RPR  2. Thalassemia alpha carrier - Declined partner testing   3. [redacted] weeks gestation of pregnancy  4. Language barrier - Virtual interpreter used  Preterm labor symptoms and general obstetric precautions including but not limited to vaginal bleeding, contractions, leaking of fluid and fetal movement were reviewed in detail with the patient. Please  refer to After Visit Summary for other counseling recommendations.   Return in about 2 weeks (around 10/30/2020) for LOB, In-Person, any provider.  No future appointments.  Vonzella Nipple, PA-C

## 2020-10-17 ENCOUNTER — Encounter: Payer: Self-pay | Admitting: Medical

## 2020-10-17 DIAGNOSIS — O24419 Gestational diabetes mellitus in pregnancy, unspecified control: Secondary | ICD-10-CM | POA: Insufficient documentation

## 2020-10-17 LAB — CBC
Hematocrit: 34.6 % (ref 34.0–46.6)
Hemoglobin: 10.5 g/dL — ABNORMAL LOW (ref 11.1–15.9)
MCH: 23.4 pg — ABNORMAL LOW (ref 26.6–33.0)
MCHC: 30.3 g/dL — ABNORMAL LOW (ref 31.5–35.7)
MCV: 77 fL — ABNORMAL LOW (ref 79–97)
Platelets: 199 10*3/uL (ref 150–450)
RBC: 4.48 x10E6/uL (ref 3.77–5.28)
RDW: 13.6 % (ref 11.7–15.4)
WBC: 13.5 10*3/uL — ABNORMAL HIGH (ref 3.4–10.8)

## 2020-10-17 LAB — RPR: RPR Ser Ql: NONREACTIVE

## 2020-10-17 LAB — GLUCOSE TOLERANCE, 2 HOURS W/ 1HR
Glucose, 1 hour: 188 mg/dL — ABNORMAL HIGH (ref 65–179)
Glucose, 2 hour: 139 mg/dL (ref 65–152)
Glucose, Fasting: 72 mg/dL (ref 65–91)

## 2020-10-17 LAB — HIV ANTIBODY (ROUTINE TESTING W REFLEX): HIV Screen 4th Generation wRfx: NONREACTIVE

## 2020-10-18 ENCOUNTER — Telehealth: Payer: Self-pay

## 2020-10-18 DIAGNOSIS — O24419 Gestational diabetes mellitus in pregnancy, unspecified control: Secondary | ICD-10-CM

## 2020-10-18 MED ORDER — ACCU-CHEK GUIDE W/DEVICE KIT: 1.0000 | PACK | Freq: Once | 0 refills | Status: AC

## 2020-10-18 MED ORDER — ACCU-CHEK SOFTCLIX LANCETS MISC: 6 refills | Status: DC

## 2020-10-18 MED ORDER — ACCU-CHEK GUIDE VI STRP: ORAL_STRIP | 6 refills | Status: DC

## 2020-10-18 NOTE — Telephone Encounter (Addendum)
-----   Message from Marny Lowenstein, PA-C sent at 10/17/2020 12:51 PM EDT ----- Patient failed GTT. Please inform and set up for supplies and education. Patient does not have MyChart.   Vonzella Nipple, PA-C 10/17/2020 12:51 PM    Supplies ordered, diabetes education referral placed, and front office notified to schedule appt. Called pt with [employer] interpreter ID [phone]; VM left requesting a callback. Will attempt to contact a second time.

## 2020-10-22 NOTE — Telephone Encounter (Signed)
Called pt w/[employer] interpreter # [phone] and left a message stating that an appointment with the Diabetes Educator has been scheduled in our office for tomorrow 3/29 @ 3:15pm. She may call back if she has questions.

## 2020-10-23 ENCOUNTER — Encounter: Payer: 59 | Attending: Medical | Admitting: Registered"

## 2020-10-23 ENCOUNTER — Other Ambulatory Visit: Payer: Self-pay

## 2020-10-23 ENCOUNTER — Ambulatory Visit: Payer: 59 | Admitting: Registered"

## 2020-10-23 DIAGNOSIS — O24419 Gestational diabetes mellitus in pregnancy, unspecified control: Secondary | ICD-10-CM

## 2020-10-23 NOTE — Progress Notes (Signed)
In-person Interpreter services provided by Marian Sorrow from [employer]  Patient was seen on 10/23/20 for Gestational Diabetes self-management. EDD 12/29/20; [redacted]w[redacted]d . Patient states no history of GDM. Diet history obtained. Patient her standard cultural diet with noodles or rice at each meal. Beverages include whole milk, water.    The following learning objectives were met by the patient :   States the definition of Gestational Diabetes  States why dietary management is important in controlling blood glucose  Describes the effects of carbohydrates on blood glucose levels  Demonstrates ability to create a balanced meal plan  Demonstrates carbohydrate counting   States when to check blood glucose levels  Demonstrates proper blood glucose monitoring techniques  States the effect of stress and exercise on blood glucose levels  States the importance of limiting caffeine and abstaining from alcohol and smoking  Plan:  Aim for 3 Carbohydrate Choices per meal (45 grams) +/- 1 either way  Aim for 1-2 Carbohydrate Choices per snack Begin reading food labels for Total Carbohydrate of foods If OK with your MD, consider  increasing your activity level by walking, Arm Chair Exercises or other activity daily as tolerated Begin checking Blood Glucose before breakfast and 2 hours after first bite of breakfast, lunch and dinner as directed by MD  Bring Log Book/Sheet and meter to every medical appointment  Take medication if directed by MD  Blood glucose monitor given: Accu-chek Guide Me Lot [id number]: 09/16/2021 CBG: 97 mg/dL  Rx order placed for  Accu-chek Guide strips and Softclix lancets  Patient instructed to monitor glucose levels: FBS: 60 - 95 mg/dl 2 hour: <120 mg/dl  Patient received the following handouts:  Nutrition Diabetes and Pregnancy  Carbohydrate Counting List  Blood glucose Log Sheet  Patient will be seen for follow-up as needed.

## 2020-10-25 ENCOUNTER — Other Ambulatory Visit: Payer: Self-pay

## 2020-10-25 ENCOUNTER — Other Ambulatory Visit: Payer: Self-pay | Admitting: *Deleted

## 2020-10-25 DIAGNOSIS — O24419 Gestational diabetes mellitus in pregnancy, unspecified control: Secondary | ICD-10-CM

## 2020-10-30 ENCOUNTER — Other Ambulatory Visit: Payer: Self-pay

## 2020-10-30 ENCOUNTER — Ambulatory Visit (INDEPENDENT_AMBULATORY_CARE_PROVIDER_SITE_OTHER): Payer: 59 | Admitting: Advanced Practice Midwife

## 2020-10-30 VITALS — BP 117/76 | HR 89 | Wt 137.0 lb

## 2020-10-30 DIAGNOSIS — O24410 Gestational diabetes mellitus in pregnancy, diet controlled: Secondary | ICD-10-CM

## 2020-10-30 DIAGNOSIS — Z789 Other specified health status: Secondary | ICD-10-CM

## 2020-10-30 DIAGNOSIS — O0993 Supervision of high risk pregnancy, unspecified, third trimester: Secondary | ICD-10-CM

## 2020-10-30 DIAGNOSIS — Z3A31 31 weeks gestation of pregnancy: Secondary | ICD-10-CM

## 2020-10-30 DIAGNOSIS — O24419 Gestational diabetes mellitus in pregnancy, unspecified control: Secondary | ICD-10-CM

## 2020-10-30 NOTE — Progress Notes (Signed)
+   Fetal movement. Pt states blood sugars are running well. Fasting sugars are in the 80's. Post prandial 90s-110s. She is not currently on any medication.

## 2020-10-30 NOTE — Patient Instructions (Signed)
Gestational Diabetes Mellitus, Diagnosis Gestational diabetes mellitus is a form of diabetes. It can happen when you are pregnant. The diabetes goes away after you give birth. If you do not get treated for this condition, it may cause problems for you or your baby. What are the causes? This condition is caused by changes in your body when you are pregnant. When these happen:  A part of the body called the pancreas does not make enough insulin.  The body cannot use insulin in the right way. Sugars cannot get into cells in your body. The sugars stay in your blood. This leads to high blood sugar.   What increases the risk?  Being older than age 25 when pregnant.  Having someone with diabetes in your family.  Too much body weight.  Having had this condition in the past.  Polycystic ovary syndrome.  Being pregnant with more than one baby. What are the signs or symptoms?  Being thirsty often.  Being hungry often.  Needing to pee more often. How is this treated?  Eat a healthy diet.  Get more exercise.  Check your blood sugar often.  Take insulin and other medicines, if needed.  Work with an expert on this condition, if told. Follow these instructions at home: Learn about your diabetes Ask your doctor:  How often should I check my blood sugar? Where do I get the equipment?  What medicines do I need? When should I take them?  Do I need to meet with an educator?  Who can I call if I have questions?  Where can I find a support group? General instructions  Take medicines only as told by your doctor.  Stay at a healthy weight.  Drink enough fluid to keep your pee pale yellow.  Wear an alert bracelet or carry a card that shows you have this condition.  Keep all follow-up visits. Where to find more information  American Diabetes Association (ADA): diabetes.org  Association of Diabetes Care & Education Specialists (ADCES): diabeteseducator.org  Centers for  Disease Control and Prevention (CDC): cdc.gov  American Pregnancy Association: americanpregnancy.org  [place] Department of Agriculture MyPlate: myplate.gov Contact a doctor if:  Your blood sugar is at or above 240 mg/dL (13.3 mmol/L).  Your blood sugar is at or above 200 mg/dL (11.1 mmol/L) and you have ketones in your pee.  You have a fever.  You are sick for 2 days or more and you do not get better.  You have either of these problems for more than 6 hours: ? You vomit every time you eat or drink. ? You have watery poop (diarrhea). Get help right away if:  You cannot think clearly.  You are not breathing well.  You have a lot of ketones in your pee.  Your baby seems to move less than normal.  Abnormal fluid or blood starts to come out of your vagina.  You start having contractions before your due date. You may feel your belly tighten.  You have a very bad headache. These symptoms may be an emergency. Get help right away. Call your local emergency services (911 in the [place]  Do not wait to see if the symptoms will go away.  Do not drive yourself to the hospital. Summary  Gestational diabetes is a form of diabetes. It can happen when you are pregnant.  This condition occurs when your body cannot make or use insulin in the right way.  Eat a healthy diet, exercise, and use medicines or insulin   as told by your doctor.  Tell your doctor if your blood sugar is high, you have a fever, or you vomit every time you eat or drink.  Get help right away if you cannot think clearly, you are not breathing well, or your baby seems to move less than normal. This information is not intended to replace advice given to you by your health care provider. Make sure you discuss any questions you have with your health care provider. Document Revised: 12/19/2019 Document Reviewed: 12/19/2019 Elsevier Patient Education  2021 Elsevier Inc.  

## 2020-10-31 NOTE — Progress Notes (Signed)
   PRENATAL VISIT NOTE  Subjective:  Mary Shepherd is a 25 y.o. G1P0 at [redacted]w[redacted]d being seen today for ongoing prenatal care.  She is currently monitored for the following issues for this high-risk pregnancy and has Supervision of normal pregnancy, antepartum; Thalassemia alpha carrier; and Gestational diabetes mellitus (GDM) affecting pregnancy, antepartum on their problem list.  Patient reports no complaints.  Contractions: Not present. Vag. Bleeding: None.  Movement: Present. Denies leaking of fluid.   The following portions of the patient's history were reviewed and updated as appropriate: allergies, current medications, past family history, past medical history, past social history, past surgical history and problem list. Problem list updated.  Objective:   Vitals:   10/30/20 1533  BP: 117/76  Pulse: 89  Weight: 137 lb (62.1 kg)    Fetal Status: Fetal Heart Rate (bpm): 147 Fundal Height: 30 cm Movement: Present     General:  Alert, oriented and cooperative. Patient is in no acute distress.  Skin: Skin is warm and dry. No rash noted.   Cardiovascular: Normal heart rate noted  Respiratory: Normal respiratory effort, no problems with respiration noted  Abdomen: Soft, gravid, appropriate for gestational age.  Pain/Pressure: Absent     Pelvic: Cervical exam deferred        Extremities: Normal range of motion.  Edema: None  Mental Status: Normal mood and affect. Normal behavior. Normal judgment and thought content.   Assessment and Plan:  Pregnancy: G1P0 at [redacted]w[redacted]d  1. Supervision of high risk pregnancy in third trimester - Routine care, advised daily kick counts, interventions for low kick number, indications for evaluation in MAU  2. Diet controlled gestational diabetes mellitus (GDM), antepartum - Compliant - CBG log scanned into media tab - Needs growth scan next week per MFM  - [place] MFM OB FOLLOW UP; Future  3. [redacted] weeks gestation of pregnancy   4. Language barrier affecting health  care - Remote interpreter Mariel Aloe utilized for all patient interaction  Preterm labor symptoms and general obstetric precautions including but not limited to vaginal bleeding, contractions, leaking of fluid and fetal movement were reviewed in detail with the patient. Please refer to After Visit Summary for other counseling recommendations.  Return in about 2 weeks (around 11/13/2020) for MD in two weeks.  Future Appointments  Date Time Provider Department Center  11/13/2020  3:30 PM [place] Bing, MD [facility] None  11/22/2020  3:45 PM [facility]    Calvert Cantor, CNM

## 2020-11-13 ENCOUNTER — Ambulatory Visit (INDEPENDENT_AMBULATORY_CARE_PROVIDER_SITE_OTHER): Payer: 59 | Admitting: Obstetrics and Gynecology

## 2020-11-13 ENCOUNTER — Other Ambulatory Visit: Payer: Self-pay

## 2020-11-13 VITALS — BP 121/82 | HR 91 | Wt 139.0 lb

## 2020-11-13 DIAGNOSIS — Z789 Other specified health status: Secondary | ICD-10-CM

## 2020-11-13 DIAGNOSIS — Z3A33 33 weeks gestation of pregnancy: Secondary | ICD-10-CM

## 2020-11-13 DIAGNOSIS — O0993 Supervision of high risk pregnancy, unspecified, third trimester: Secondary | ICD-10-CM

## 2020-11-13 DIAGNOSIS — O24419 Gestational diabetes mellitus in pregnancy, unspecified control: Secondary | ICD-10-CM

## 2020-11-13 NOTE — Progress Notes (Signed)
    PRENATAL VISIT NOTE  Subjective:  Mary Shepherd is a 25 y.o. G1P0 at 101w3d being seen today for ongoing prenatal care.  She is currently monitored for the following issues for this high-risk pregnancy and has Supervision of normal pregnancy, antepartum; Thalassemia alpha carrier; Gestational diabetes mellitus (GDM) affecting pregnancy, antepartum; and Language barrier on their problem list.  Patient reports no complaints.  Contractions: Not present. Vag. Bleeding: None.  Movement: Present. Denies leaking of fluid.   The following portions of the patient's history were reviewed and updated as appropriate: allergies, current medications, past family history, past medical history, past social history, past surgical history and problem list.   Objective:   Vitals:   11/13/20 1552  BP: 121/82  Pulse: 91  Weight: 139 lb (63 kg)    Fetal Status: Fetal Heart Rate (bpm): 160   Movement: Present     General:  Alert, oriented and cooperative. Patient is in no acute distress.  Skin: Skin is warm and dry. No rash noted.   Cardiovascular: Normal heart rate noted  Respiratory: Normal respiratory effort, no problems with respiration noted  Abdomen: Soft, gravid, appropriate for gestational age.  Pain/Pressure: Present     Pelvic: Cervical exam deferred        Extremities: Normal range of motion.  Edema: Trace  Mental Status: Normal mood and affect. Normal behavior. Normal judgment and thought content.   Assessment and Plan:  Pregnancy: G1P0 at [redacted]w[redacted]d 1. [redacted] weeks gestation of pregnancy  2. Language barrier Interpreter used  3. Gestational diabetes mellitus (GDM) affecting pregnancy, antepartum Normal am fasting and 2h PP except a few, sporadic in the 130s-150s which she states is due to rice and noodles. Post prandial walk recommended and try brown rice, whole wheat noodles and noodles made from beans  Has surveillance growth on 4/28  4. Supervision of high risk pregnancy in third  trimester Routine care.   Preterm labor symptoms and general obstetric precautions including but not limited to vaginal bleeding, contractions, leaking of fluid and fetal movement were reviewed in detail with the patient. Please refer to After Visit Summary for other counseling recommendations.   Return in about 10 days (around 11/23/2020) for in person, md visit.  Future Appointments  Date Time Provider Department Center  11/22/2020  3:45 PM [facility]    [place] Bing, MD

## 2020-11-22 ENCOUNTER — Ambulatory Visit: Payer: 59 | Attending: Advanced Practice Midwife

## 2020-11-22 ENCOUNTER — Other Ambulatory Visit: Payer: Self-pay

## 2020-11-22 DIAGNOSIS — O24410 Gestational diabetes mellitus in pregnancy, diet controlled: Secondary | ICD-10-CM

## 2020-11-22 DIAGNOSIS — Z3A34 34 weeks gestation of pregnancy: Secondary | ICD-10-CM | POA: Diagnosis not present

## 2020-11-23 ENCOUNTER — Other Ambulatory Visit: Payer: Self-pay | Admitting: *Deleted

## 2020-11-23 DIAGNOSIS — O24410 Gestational diabetes mellitus in pregnancy, diet controlled: Secondary | ICD-10-CM

## 2020-11-26 ENCOUNTER — Other Ambulatory Visit: Payer: Self-pay

## 2020-11-26 ENCOUNTER — Encounter: Payer: Self-pay | Admitting: Obstetrics and Gynecology

## 2020-11-26 ENCOUNTER — Ambulatory Visit (INDEPENDENT_AMBULATORY_CARE_PROVIDER_SITE_OTHER): Payer: 59 | Admitting: Obstetrics and Gynecology

## 2020-11-26 VITALS — BP 130/81 | HR 93 | Wt 139.0 lb

## 2020-11-26 DIAGNOSIS — Z3400 Encounter for supervision of normal first pregnancy, unspecified trimester: Secondary | ICD-10-CM

## 2020-11-26 DIAGNOSIS — Z3A35 35 weeks gestation of pregnancy: Secondary | ICD-10-CM

## 2020-11-26 DIAGNOSIS — O24419 Gestational diabetes mellitus in pregnancy, unspecified control: Secondary | ICD-10-CM

## 2020-11-26 DIAGNOSIS — Z789 Other specified health status: Secondary | ICD-10-CM

## 2020-11-26 NOTE — Progress Notes (Signed)
   PRENATAL VISIT NOTE  Subjective:  Mary Shepherd is a 25 y.o. G1P0 at [redacted]w[redacted]d being seen today for ongoing prenatal care.  She is currently monitored for the following issues for this high-risk pregnancy and has Supervision of normal pregnancy, antepartum; Thalassemia alpha carrier; Gestational diabetes mellitus (GDM) affecting pregnancy, antepartum; and Language barrier on their problem list.  Patient reports no complaints.  Contractions: Not present. Vag. Bleeding: None.  Movement: Present. Denies leaking of fluid.   The following portions of the patient's history were reviewed and updated as appropriate: allergies, current medications, past family history, past medical history, past social history, past surgical history and problem list.   Objective:   Vitals:   11/26/20 1513  BP: 130/81  Pulse: 93  Weight: 139 lb (63 kg)    Fetal Status: Fetal Heart Rate (bpm): 158 Fundal Height: 35 cm Movement: Present     General:  Alert, oriented and cooperative. Patient is in no acute distress.  Skin: Skin is warm and dry. No rash noted.   Cardiovascular: Normal heart rate noted  Respiratory: Normal respiratory effort, no problems with respiration noted  Abdomen: Soft, gravid, appropriate for gestational age.  Pain/Pressure: Absent     Pelvic: Cervical exam deferred        Extremities: Normal range of motion.  Edema: None  Mental Status: Normal mood and affect. Normal behavior. Normal judgment and thought content.   Assessment and Plan:  Pregnancy: G1P0 at [redacted]w[redacted]d 1. [redacted] weeks gestation of pregnancy   2. Supervision of normal first pregnancy, antepartum Patient is doing well without complaints Cultures next visit Patient undecided on contraception and pediatrician- list provided  3. Gestational diabetes mellitus (GDM) affecting pregnancy, antepartum CBGs reviewed and within range Continue diet control Follow up growth ultrasound  4. Language barrier Interpreter present  Preterm labor  symptoms and general obstetric precautions including but not limited to vaginal bleeding, contractions, leaking of fluid and fetal movement were reviewed in detail with the patient. Please refer to After Visit Summary for other counseling recommendations.   Return in about 1 week (around 12/03/2020) for in person, ROB, High risk, gbs.  Future Appointments  Date Time Provider Department Center  11/26/2020  3:30 PM [place] Horacek, Gigi Gin, MD [facility] None  12/19/2020  3:30 PM [facility] NURSE [facility]  12/19/2020  3:45 PM [facility]    Catalina Antigua, MD

## 2020-11-26 NOTE — Progress Notes (Signed)
ROB [redacted]w[redacted]d  *Spouse present to translate during work-up.  Pt has blood sugar log today.  CC: None

## 2020-11-26 NOTE — Patient Instructions (Signed)
AREA PEDIATRIC/FAMILY PRACTICE PHYSICIANS  [place] ([address]) . [facility] o Chambliss, MD; Eniola, MD; Hale, MD; Hensel, MD; McDiarmid, MD; McIntyer, MD; Neal, MD; Walden, MD o [address] [place], [place] [address] o [phone] o Mon-Fri 8:30-12:30, 1:30-5:00 o Providers come to see babies at [facility] o Accepting Medicaid . [facility] o Limited providers who accept newborns: Koirala, MD; Morrow, MD; Wolters, MD o [address], [place], [place] [address] o [phone] o Mon-Fri 8:00-5:30 o Babies seen by providers at [facility] o Does NOT accept Medicaid o Please call early in hospitalization for appointment (limited availability)  . [facility] o Mulberry, MD o [address] [place], [place] [address] o [phone] o Mon, Tue, Thur, Fri 8:30-5:00, Wed 10:00-7:00 (closed 1-2pm) o Babies seen by [facility] providers o Accepting Medicaid . [facility] o Rubin, MD o [address], [place], [place] [address] o [phone] o Mon-Fri 8:30-5:00, Sat 8:30-12:00 o Provider comes to see babies at [facility] o Accepting Medicaid o Must have been referred from current patients or contacted office prior to delivery . [facility] ([facility] o Brown, MD; Chandler, MD; Ettefagh, MD; Grant, MD; Lester, MD; McCormick, MD; McQueen, MD; Prose, MD; Simha, MD; Stanley, MD; Stryffeler, NP; Tebben, NP o [address], [place], [place] [address] o [phone] o Mon, Tue, Thur, Fri 8:30-5:30, Wed 9:30-5:30, Sat 8:30-12:30 o Babies seen by [facility] providers o Accepting Medicaid o Only accepting infants of first-time parents or siblings of current patients o Hospital discharge coordinator will make follow-up appointment . Jack Amos o [address],  [place], [place]  [address] o [phone]   Fax - [phone] . [facility] o [address], [place], [place]  [address] o Phone - [phone]   Fax - [phone] . Shilpa Gosrani o [address], [place], [place]  [address] o [phone]  [place] ([address]) . [facility] o Bates, MD; Brassfield, MD; Cooper, Cox, MD; MD; Davis, MD; Dovico, MD; Ettefaugh, MD; Little, MD; Lowe, MD; Keiffer, MD; Melvin, MD; Sumner, MD; Williams, MD o [address], [place], [place] [address] o [phone] o Mon-Fri 8:30-5:00 (extended evenings Mon-Thur as needed), Sat-Sun 10:00-1:00 o Providers come to see babies at [facility] o Accepting Medicaid for families of first-time babies and families with all children in the household age 3 and under. Must register with office prior to making appointment (M-F only). . [facility] o Henson, NP; Knapp, MD; Lalonde, MD; Tysinger, PA o [address] [place], [place] [address] o [phone] o Mon-Fri 8:00-5:00 o Babies seen by providers at [facility] o Does NOT accept Medicaid/Commercial Insurance Only . [facility] ([facility]  o Artis, MD; Barnes, MD; Bratton, MD; Coccaro, MD; Lockett Gardner, MD; Kramer, MD; Marshall, MD; Netherton, MD; Poleto, MD; Skinner, MD o [address] [place], [place] [address] o [phone] o Mon-Fri 8:30-5:30, Sat (Oct.-Mar.) 9:00-1:00 o Babies seen by providers at [facility] o Accepting Medicaid  [place] ([address]) . [facility] o Reid, MD; Warner, MD o [address], [place], [place] [address] o [phone] o Mon-Fri 8:30-5:00, Sat 8:30-12:00 o Providers come to see babies at [facility] o Does NOT accept Medicaid . [facility] o Becker, PA; Hagler, MD; Scifres, PA; Sun, MD; Swayne, MD o [address],  [place], [place] [address] o [phone] o Mon-Fri 8:00-5:00 o Babies seen by providers at [facility] o Does NOT accept Medicaid o Only accepting babies of parents who are patients o Please call early in hospitalization for appointment (limited availability) . [facility] o Clark, MD; Frye, MD; Kelleher, MD; Mack, NP; Miller, MD; O'Keller, MD; Patterson, NP; Pudlo, MD; Puzio, MD; Thomas, MD; Tucker, MD; Twiselton, MD o [address], [place], [place] [address] o [phone] o Mon-Fri 8:00-5:00, Sat 9:00-12:00 o Providers come to see babies at [facility] o Does NOT accept Medicaid  [place] ([address]) . [facility] o Limited providers accepting new patients: Brake, NP; Wharton, PA o [address], [place], [place] [address] o [phone] o Mon-Fri 8:00-5:00 o Babies seen by providers at [facility] o Does NOT accept Medicaid o Only accepting babies of parents who are patients o Please call early in hospitalization for appointment (limited availability) . [facility] o Gay, MD; Quinlan, MD o [address] [place], [place] [address] o [phone] (press [phone] to schedule appointment) o Mon-Fri 8:00-5:00 o Providers come to see babies at [facility] o Does NOT accept Medicaid . [facility] o Mazer, MD o [address] [place], [place] [address] o [phone] o Mon-Fri 8:30-5:00 (lunch 12:30-1:00), extended hours by appointment only Wed 5:00-6:30 o Babies seen by [facility] providers o Accepting Medicaid . [facility] o Banks, MD; Jordan, MD; Koberlein, MD o [address], [place], [place] [address] o [phone] o Mon-Fri 8:00-5:00 o Babies seen by [facility] providers o Does NOT accept Medicaid . [facility] o Parker, MD; Hunter, MD; Wallace, DO o [address] [place], [place]  [address] o [phone] o Mon-Fri 8:00-5:00 o Babies seen by [facility] providers o Does NOT accept Medicaid . [facility] o Brandon, PA; Brecken, PA; Christy, NP; Dees, MD; DeClaire, MD; DeWeese, MD; Hansen, NP; Mills, NP; Parrish, NP; Smoot, NP; Summer, MD; Vapne, MD o [address] [place], [place] [address] o [phone] o Mon-Fri 8:30-5:00, Sat 10:00-1:00 o Providers come to see babies at [facility] o Does NOT accept Medicaid o Free prenatal information session Tuesdays at 4:45pm . [facility] o Bouska, MD; Gordon, PA; Jeffery, PA; Weber, PA o [address] [place] [address] o [phone] o Mon-Fri 7:30-5:30 o Babies seen by [facility] providers . [facility] o [address], [place], [place]  [address] o [phone]   Fax - [phone]  [place] ([address] & [address]) . [facility] o Reese, MD o [address] [place], [place] [address] o [phone] o Mon-Thur 8:00-6:00 o Providers come to see babies at [facility] o Accepting Medicaid . [facility] o Anderson, NP; Badger, MD; Beal, PA; Spencer, PA o [address] [place], [place] [address] o [phone] o Mon-Thur 7:30-7:30, Fri 7:30-4:30 o Babies seen by [facility] providers o Accepting Medicaid . [facility] o Agbuya, MD; Klett, NP; Romgoolam, MD o [address], [place],  [address] o [phone] o Mon-Fri 8:30-5:00, Sat 8:30-12:00 o Providers come to see babies at [facility] o Accepting Medicaid o Must have "Meet & Greet" appointment at office prior to delivery . [facility] o McCord,   MD; Wallace, MD; Wood, MD o [address], [place], [place] [address] o [phone] o Mon-Wed 8:00-6:00, Thur-Fri 8:00-5:00, Sat 9:00-12:00 o Providers come to  see babies at [facility] o Does NOT accept Medicaid o Only accepting siblings of current patients . [facility]  o [address], [place], [place]  [address] o [phone]   Fax - [phone] . [facility] o [address], [place], [place]  [address] o [phone]   Fax - [phone]  [place] ([address] & [address]) . [facility] o Cirigliano, DO; Matthews, DO o [address] [place], [place] [address] o [phone] o Mon-Fri 7:00-5:00 o Babies seen by [facility] providers o Does NOT accept Medicaid . [facility] o Briscoe, MD; Howley, PA; Moreira, PA o [address], [place], [place] [address] o [phone] o Mon-Fri 8:00-5:00 o Babies seen by [facility] providers o Accepting Medicaid . [facility] o Boyd, MD; Church, PA; Jones, NP; Osborn, PA o [address], [place], [place] [address] o [phone] o Mon-Fri 8:00-5:00 o Babies seen by providers at [facility] o Accepting Medicaid  [place] ([address]) . [facility] [place] o Wendling, DO o [address] [place], [place] [address] o [phone] o Mon-Fri 8:00-5:00 o Babies seen by [facility] providers o Does NOT accept Medicaid o Limited availability, please call early in hospitalization to schedule follow-up . [facility] o Calderon, PA; Cummings, MD; Dillard, MD; Martin, PA; Olson, MD; VanDeven, PA o [address], [place], [place] [address] o [phone] o Mon-Fri 8:30-5:00, Sat 9:00-12:00 o Babies seen by providers at [facility] o Accepting Medicaid o Please register online then schedule online or call office o [email or web address] . [facility] o Hunter, NP; Kumar, MD; Martin Rogers, PA o [address], [place], [place] [address] o [phone] o Mon-Fri 8:00-5:00 o Babies seen by providers at [facility] o Accepting Medicaid . [facility] o Perkins, MD; Kristi Fleenor, NP; West, MD o [address], [place], [place] [address] o [phone] o Mon-Fri 8:00-5:30, Sat&Sun by appointment (phones open at 8:30) o Babies seen by [facility] providers o Accepting Medicaid o Must be a first-time baby or sibling of current patient . [facility] [place]  o [address], [place], [place]  [address] o [phone]   Fax - [phone]  [place] ([address]) . [facility] o Brown, PA; Cowen, PA; Rice, MD; Helton, PA; Spry, MD o [address] [place], [place] [address] o [phone] o Mon-Thur 8:00-7:00, Fri 8:00-5:00, Sat 8:00-12:00, Sun 9:00-12:00 o Babies seen by [facility] providers o Accepting Medicaid . [facility] [place] o Coe-Goins, MD; Marshall, MD; Pierre-Louis, MD o [address] [place] [address], [place], [place] [address] o [phone] o Mon-Thur 8:00-5:00 o Babies seen by providers at [facility] o Accepting Medicaid . [facility] o Bratton, MD; Coe-Goins, MD; Hayes, MD; Lewis, MD; List, MD; Lott, MD; Marshall, MD; Moran, MD; O'Neal, MD; Pierre-Louis, MD; Pitonzo, MD; Scholer, MD; Spangle, MD o [address] [place], [place]   [address] o [phone] o Mon-Fri 8:00-5:30, Sat (Oct.-Mar.) 9:00-1:00 o Babies seen by providers at [facility] o Accepting Medicaid o Must fill out new patient packet, available online at [email or web address] . [facility] ([facility] o Friddle, NP; Harris, NP; Kelly, NP; Logan, MD;  Melvin, PA; Poth, MD; Ramadoss, MD; Stanton, NP o [address], [place], [place] [address] o [phone] o Mon-Thur 8:00-5:30, Fri 8:00-5:00 o Babies seen by providers at [facility] o Accepting Medicaid  [place] ([address]) . [facility] o Dixon, PA; [place], MD; Pickard, MD; Tapia, PA o [address], [place], [place] [address] o [phone] o Mon-Fri 8:00-5:00 o Babies seen by providers at [facility] o Accepting Medicaid   [place] ([address]) . [facility] [place] o Masneri, DO; Meyers, MD; Nelson, PA o [address], [place], [place] [address] o [phone] o Mon-Fri 8:00-5:00 o Babies seen by providers at [facility] o Does NOT accept Medicaid o Limited appointment availability, please call early in hospitalization  . [facility] [place] o Kunedd, DO; McGowen, MD o [address], [place], [place] [address] o [phone] o Mon-Fri 8:00-5:00 o Babies seen by [facility] providers o Does NOT accept Medicaid . [facility] [place] o Cameron, MD; MacDonald, MD; Michaels, PA; Nayak, MD o [address] [place] [address], [place], [place] [address] o [phone] o Mon-Fri 8:00-5:00 o After hours clinic ([address] , [place] [address]) [phone] Mon-Fri 5:00-8:00, Sat 12:00-6:00, Sun 10:00-4:00 o Babies seen by [facility] providers o Accepting Medicaid . [facility] [place] o [address], [place], [place]  [address] o [phone]   Fax - [phone]  [place] ([address]) . [facility] [place] [facility] o Andy, MD o [address], [place], [place] [address] o [phone] o Mon-Fri 8:00-5:00 o Babies seen by [facility] providers o Does NOT accept Medicaid . [facility] [place] ([facility] [place]) o Eksir, MD o [address], [place], [place]  [address] o [phone] o Mon-Thur 8:00-7:00, Fri 8:00-5:00, Sat 8:00-12:00 o Babies seen by providers at [facility] o Accepting Medicaid - but does not have vaccinations in office (must be received elsewhere) o Limited availability, please call early in hospitalization  [place] ([address]) . [facility]  o Charlene Flemming, MD o [address], [place] [address] o [phone]  Fax [phone]   

## 2020-12-04 ENCOUNTER — Ambulatory Visit (INDEPENDENT_AMBULATORY_CARE_PROVIDER_SITE_OTHER): Payer: 59 | Admitting: Obstetrics & Gynecology

## 2020-12-04 ENCOUNTER — Other Ambulatory Visit (HOSPITAL_COMMUNITY)
Admission: RE | Admit: 2020-12-04 | Discharge: 2020-12-04 | Disposition: A | Discharge status: Home / Self Care | Payer: 59 | Source: Ambulatory Visit | Attending: Obstetrics & Gynecology | Admitting: Obstetrics & Gynecology

## 2020-12-04 ENCOUNTER — Other Ambulatory Visit: Payer: Self-pay

## 2020-12-04 VITALS — BP 121/84 | HR 96 | Wt 139.0 lb

## 2020-12-04 DIAGNOSIS — O24419 Gestational diabetes mellitus in pregnancy, unspecified control: Secondary | ICD-10-CM

## 2020-12-04 DIAGNOSIS — Z3A35 35 weeks gestation of pregnancy: Secondary | ICD-10-CM | POA: Insufficient documentation

## 2020-12-04 NOTE — Progress Notes (Signed)
ROB/GBS, reports no problems today. 

## 2020-12-04 NOTE — Patient Instructions (Signed)
Sinh n? qua m ??o Vaginal Delivery  Sinh n? qua m ??o c ngh?a l qu v? sinh con b?ng cch ??y em b ra kh?i ?ng ?? (m ??o). ??i ng? nhn vin y t? s? gip qu v? tr??c, trong v sau khi sinh n? qua m ??o. Tr?i nghi?m sinh con l duy nh?t ??i v?i m?i ng??i ph? n? v m?i l?n mang thai v tr?i nghi?m sinh con s? thay ??i ty thu?c vo n?i m qu v? ch?n ?? sinh n?. ?i?u g x?y ra khi ti ??n trung tm h? sinh ho?c b?nh vi?n? Khi qu v? ?ang chuy?n d? v ? ???c nh?p b?nh vi?n ho?c ? trung tm h? sinh, chuyn gia ch?m [place] s?c kh?e c th?:  Xem xt ti?n s? mang [place] c?a qu v? v b?t k? lo ng?i no qu v? c.  ??t m?t ???ng truy?n t?nh m?ch (IV) vo m?t trong cc t?nh m?ch c?a qu v?. ???ng truy?n ny c th? ???c dng ?? cung c?p d?ch v thu?c cho qu v?.  Ki?m tra huy?t p, m?ch, nhi?t ?? v nh?p tim (cc d?u hi?u sinh t?n) c?a qu v?.  Ki?m tra xem ti n??c ?i (ti ?i) c?a qu v? ? v? (rch) ch?a.  Trao ??i v?i qu v? v? k? ho?ch sinh n? v th?o lu?n cc ph??ng n ki?m sot ?au. Theo di Chu[place] gia ch?m [place] s?c kh?e c th? theo di cc c?n co bp (theo di t? cung) c?a qu v? v nh?p tim c?a em b (theo di [place] nhi). Qu v? c th? c?n ???c theo di:  Th??ng xuyn, nh?ng khng lin t?c (cch qung).  Ton b? th?i gian hay trong th?i gian di (lin t?c). Theo di lin t?c c th? c?n thi?t n?u: ? Qu v? ?ang dng m?t s? lo?i thu?c nh?t ??nh, ch?ng h?n nh? thu?c gi?m ?au ho?c thu?c lm cho t? cung co bp m?nh h?n. ? Qu v? c bi?n ch?ng [place] k? ho?c bi?n ch?ng chuy?n d?. Theo di c th? ???c th?c hi?n b?ng cch:  ??t m?t ?ng nghe ??c bi?t ho?c thi?t b? theo di c?m tay ln b?ng c?a qu v? ?? ki?m tra nh?p ??p c?a tim c?a em b v ki?m tra cc c?n co bp ? b?ng qu v?.  ??t cc my theo di ln b?ng qu v? (my theo di bn ngoi) ?? ghi l?i nh?p ??p c?a tim c?a em b v t?n su?t v th?i gian ko di c?a cc c?n co bp.  ??t cc my theo di v trong t? cung qu v? thng qua m ??o (my  theo di bn trong) ?? ghi l?i nh?p ??p c?a tim c?a em b v t?n su?t v th?i gian ko di v ?? m?nh c?a cc c?n co bp. Ty thu?c vo lo?i my theo di, n c th? n?m trong t? cung ho?c trn ??u em b cho ??n khi sinh.  ?o t? xa. ?y l ki?u theo di lin t?c c th? ???c th?c hi?n b?ng cc my theo di bn ngoi ho?c bn trong. Thay v ph?i n?m trn gi??ng, qu v? c th? di chuy?n xung quan trong khi ?o t? xa. Khm th?c th? Chuyn gia ch?m [place] s?c kh?e c th? khm th?c th? th??ng xuyn cho qu v?. ?i?u ny c th? bao g?m:  Ki?m tra v? tr v t? th? c?a em b trong t? cung qu v?.  Ki?m tra c? t? cung c?a qu v? ??  xc ??nh: ? Xem c? t? cung c ?ang m?ng h?n (m? ?i) hay khng. ? Xem c? t? cung c ?ang m? ra (gin ra) hay khng. ?i?u g x?y ra trong khi chuy?n d? v sinh n?? Chuy?n d? v sinh n? bnh th??ng ???c chia thnh ba giai ?o?n sau ?y: [place] ?o?n 1  ?y l giai ?o?n di nh?t c?a qu trnh chuy?n d?.  Giai ?o?n ny c th? ko di nhi?u gi? ho?c nhi?u ngy.  Trong su?t giai ?o?n ny, qu v? s? c?m th?y cc c?n co bp. Cc c?n co bp th??ng c?m th?y nh?, khng th??ng xuyn v khng ??u vo lc ??u. Chng tr? nn m?nh h?n, th??ng xuyn h?n (kho?ng 2-3 pht m?t l?n) v ??u h?n khi qu v? ?i qua giai ?o?n ny.  Giai ?o?n ny k?t thc khi c? t? cung gin ra hon ton ??n 4 inch (10 cm) v hon ton m? ra. Giai ?o?n 2  Giai ?o?n ny b?t ??u sau khi c? t? cung m? ra v gin r?ng hon ton v ko di ??n khi sinh em b.  Giai ?o?n ny c th? ko di t? 20 pht ??n 2 ti?ng.  ?y l giai ?o?n m qu v? s? c?m th?y mu?n ??y em b ra kh?i m ??o.  Qu v? c th? c?m th?y c?ng t?c v ?au rt, ??c bi?t l khi ph?n r?ng nh?t c?a ??u em b ?i qua c?a m ??o (c? m ??o).  Sau khi em b ???c sinh ra, dy r?n s? ???c k?p v c?t. Vi?c ny th??ng x?y ra sau th?i gian ch? 1-2 pht sau sinh.  Em b s? ???c ??t ln ng?c tr?n c?a qu v? (ti?p xc da tr?c ti?p) ? t? th? th?ng l?ng v ???c b?c b?ng m?t  ci m?n ?m. Theo di cch b m? c?a con qu v?, nh? l tm v m? v mt s?a v gip b tm v qu v? ?? b l?n ??u tin. Giai ?o?n 3  Giai ?o?n ny b?t ??u ngay sau khi qu v? sinh em b v k?t thc sau khi qu v? ??y nhau [place] ra.  Giai ?o?n ny c th? m?t t? 5 ??n 30 pht.  Sau khi sinh em b, qu v? s? c?m th?y cc c?n co bp khi c? th? ??y nhau [place] ra v t? cung co l?i ?? ki?m sot ch?y mu.   Ti c th? d? ki?n ?i?u g sau khi chuy?n d? v sinh n??  Sau khi sinh xong, qu v? v em b s? ???c theo di ch?t ch? cho ??n khi qu v? s?n sng v? nh ?? ??m b?o c? hai ??u kh?e m?nh. Nhm ch?m [place] s?c kh?e s? h??ng d?n qu v? cch ch?m [place] b?n thn v em b.  Qu v? v em b s? ? l?i cng phng (phng sinh) trong khi ? l?i b?nh vi?n. ?i?u ny s? khuy?n khch s? g?n k?t ngay t? ??u v cho b thnh cng.  Quy? vi? co? th? ti?p tu?c ????c truy?n di?ch va? thu?c thng qua m?t ???ng truy?n t?nh m?ch (IV).  T? cung c?a qu v? s? ???c ki?m tra v xoa bp th??ng xuyn (xoa bp t? cung).  Qu v? s? b? ?au nh??c ?? bu?ng, m ?a?o va? vu?ng da gi??a c?a m ?a?o va? h?u mn (?a?y ch?u).  N?u m?t v?t r?ch ???c t?o ra g?n m ??o (th? thu?t c?t t?ng sinh mn) ho?c n?u qu v?  b? rch m ??o trong khi sinh con, g?c l?nh c th? ???c ??t ln ch? lm th? thu?t c?t t?ng sinh mn ho?c v?t rch c?a qu v?. Vi?c ny gip gi?m ?au v gi?m s?ng.  Qu v? c th? ???c cung c?p m?t bnh phun ?? s? d?ng thay v lau sau khi ?i v? sinh. ?? s? d?ng bnh phun, hy lm theo nh?ng b??c sau: ? Tr??c khi ?i ti?u, ?? ??y n??c ?m vo bnh phun. Khng s?? du?ng n???c nng. ? Sau khi ?i ti?u, trong khi ng?i trn b?n c?u, s? d?ng bnh phun ?? r?a khu v?c xung [place] ?y ch?u v c?a m ??o. Vi?c ny s? r?a s?ch n??c ti?u v mu. ? ?? ??y n??c s?ch vo bnh phun m?i l?n qu v? s? d?ng nh v? sinh.  Vi?c ch?y mu m ??o sau khi sinh l bnh th??ng. Mang b?ng v? sinh ?? th?m mu v d?ch t? m ??o. Tm t?t  Sinh n? qua m ??o c  ngh?a l qu v? s? sinh con b?ng cch ??y em b ra kh?i ?ng ?? (m ??o).  Chuyn gia ch?m [place] s?c kh?e c th? theo di cc c?n co bp (theo di t? cung) c?a qu v? v nh?p tim c?a em b (theo di [place] nhi).  Chuyn gia ch?m [place] s?c kh?e c th? khm th?c th? cho qu v?.  Tr? d? v sinh n? bnh th??ng ???c chia thnh ba giai ?o?n.  Sau khi sinh xong, qu v? v em b s? ???c theo di ch?t ch? cho ??n khi s?n sng v? nh. Thng tin ny khng nh?m m?c ?ch thay th? cho l?i khuyn m chuyn gia ch?m [place] s?c kh?e ni v?i qu v?. Hy b?o ??m qu v? ph?i th?o lu?n b?t k? v?n ?? g m qu v? c v?i chuyn gia ch?m [place] s?c kh?e c?a qu v?. Document Revised: 09/06/2017 Document Reviewed: 09/06/2017 Elsevier Patient Education  2021 [employer].

## 2020-12-04 NOTE — Progress Notes (Signed)
   PRENATAL VISIT NOTE  Subjective:  Mary Shepherd is a 25 y.o. G1P0 at [redacted]w[redacted]d being seen today for ongoing prenatal care.  She is currently monitored for the following issues for this high-risk pregnancy and has Supervision of normal pregnancy, antepartum; Thalassemia alpha carrier; Gestational diabetes mellitus (GDM) affecting pregnancy, antepartum; and Language barrier on their problem list.  Patient reports no complaints.  Contractions: Not present. Vag. Bleeding: None.  Movement: Present. Denies leaking of fluid.   The following portions of the patient's history were reviewed and updated as appropriate: allergies, current medications, past family history, past medical history, past social history, past surgical history and problem list.   Objective:   Vitals:   12/04/20 1546  BP: 121/84  Pulse: 96  Weight: 139 lb (63 kg)    Fetal Status: Fetal Heart Rate (bpm): 150   Movement: Present  Presentation: Vertex  General:  Alert, oriented and cooperative. Patient is in no acute distress.  Skin: Skin is warm and dry. No rash noted.   Cardiovascular: Normal heart rate noted  Respiratory: Normal respiratory effort, no problems with respiration noted  Abdomen: Soft, gravid, appropriate for gestational age.  Pain/Pressure: Absent     Pelvic: Cervical exam performed in the presence of a chaperone Dilation: Closed Effacement (%): 20 Station: Ballotable  Extremities: Normal range of motion.  Edema: None  Mental Status: Normal mood and affect. Normal behavior. Normal judgment and thought content.   Assessment and Plan:  Pregnancy: G1P0 at [redacted]w[redacted]d 1. Gestational diabetes mellitus (GDM) affecting pregnancy, antepartum All BG values WNL  2. [redacted] weeks gestation of pregnancy F/u growth [place] in 2-3 weeks - Cervicovaginal ancillary only( [place]) - Strep Gp B NAA  Preterm labor symptoms and general obstetric precautions including but not limited to vaginal bleeding, contractions, leaking of fluid and  fetal movement were reviewed in detail with the patient. Please refer to After Visit Summary for other counseling recommendations.   Return in about 1 week (around 12/11/2020).  Future Appointments  Date Time Provider Department Center  12/12/2020  4:00 PM Marylen Ponto, NP [facility] None  12/19/2020  3:30 PM [facility]URSE [facility]  12/19/2020  3:45 PM [facility]    Scheryl Darter, MD

## 2020-12-06 LAB — STREP GP B NAA: Strep Gp B NAA: NEGATIVE

## 2020-12-06 LAB — CERVICOVAGINAL ANCILLARY ONLY
Chlamydia: NEGATIVE
Comment: NEGATIVE
Comment: NORMAL
Neisseria Gonorrhea: NEGATIVE

## 2020-12-12 ENCOUNTER — Ambulatory Visit (INDEPENDENT_AMBULATORY_CARE_PROVIDER_SITE_OTHER): Payer: 59 | Admitting: Women's Health

## 2020-12-12 ENCOUNTER — Other Ambulatory Visit: Payer: Self-pay

## 2020-12-12 VITALS — BP 123/82 | HR 89 | Wt 141.0 lb

## 2020-12-12 DIAGNOSIS — D563 Thalassemia minor: Secondary | ICD-10-CM

## 2020-12-12 DIAGNOSIS — Z789 Other specified health status: Secondary | ICD-10-CM

## 2020-12-12 DIAGNOSIS — Z3A37 37 weeks gestation of pregnancy: Secondary | ICD-10-CM

## 2020-12-12 DIAGNOSIS — Z3400 Encounter for supervision of normal first pregnancy, unspecified trimester: Secondary | ICD-10-CM

## 2020-12-12 DIAGNOSIS — O24419 Gestational diabetes mellitus in pregnancy, unspecified control: Secondary | ICD-10-CM

## 2020-12-12 NOTE — Patient Instructions (Addendum)
Maternity Assessment Unit (MAU)  The Maternity Assessment Unit (MAU) is located at the [facility] and [place] at [facility]. The address is: [address], [place], [place], [place] [address]. Please see map below for additional directions.    The Maternity Assessment Unit is designed to help you during your pregnancy, and for up to 6 weeks after delivery, with any pregnancy- or postpartum-related emergencies, if you think you are in labor, or if your water has broken. For example, if you experience nausea and vomiting, vaginal bleeding, severe abdominal or pelvic pain, elevated blood pressure or other problems related to your pregnancy or postpartum time, please come to the Maternity Assessment Unit for assistance.        Signs and Symptoms of Labor Labor is the body's natural process of moving the baby and the placenta out of the uterus. The process of labor usually starts when the baby is full-term, between 23 and 40 weeks of pregnancy. Signs and symptoms that you are close to going into labor As your body prepares for labor and the birth of your baby, you may notice the following symptoms in the weeks and days before true labor starts:  Passing a small amount of thick, bloody mucus from your vagina. This is called normal bloody show or losing your mucus plug. This may happen more than a week before labor begins, or right before labor begins, as the opening of the cervix starts to widen (dilate). For some women, the entire mucus plug passes at once. For others, pieces of the mucus plug may gradually pass over several days.  Your baby moving (dropping) lower in your pelvis to get into position for birth (lightening). When this happens, you may feel more pressure on your bladder and pelvic bone and less pressure on your ribs. This may make it easier to breathe. It may also cause you to need to urinate more often and have problems with bowel movements.  Having "practice  contractions," also called Braxton Hicks contractions or false labor. These occur at irregular (unevenly spaced) intervals that are more than 10 minutes apart. False labor contractions are common after exercise or sexual activity. They will stop if you change position, rest, or drink fluids. These contractions are usually mild and do not get stronger over time. They may feel like: ? A backache or back pain. ? Mild cramps, similar to menstrual cramps. ? Tightening or pressure in your abdomen. Other early symptoms include:  Nausea or loss of appetite.  Diarrhea.  Having a sudden burst of energy, or feeling very tired.  Mood changes.  Having trouble sleeping.   Signs and symptoms that labor has begun Signs that you are in labor may include:  Having contractions that come at regular (evenly spaced) intervals and increase in intensity. This may feel like more intense tightening or pressure in your abdomen that moves to your back. ? Contractions may also feel like rhythmic pain in your upper thighs or back that comes and goes at regular intervals. ? For first-time mothers, this change in intensity of contractions often occurs at a more gradual pace. ? Women who have given birth before may notice a more rapid progression of contraction changes.  Feeling pressure in the vaginal area.  Your water breaking (rupture of membranes). This is when the sac of fluid that surrounds your baby breaks. Fluid leaking from your vagina may be clear or blood-tinged. Labor usually starts within 24 hours of your water breaking, but it  may take longer to begin. ? Some women may feel a sudden gush of fluid. ? Others notice that their underwear repeatedly becomes damp. Follow these instructions at home:  When labor starts, or if your water breaks, call your health care provider or nurse care line. Based on your situation, they will determine when you should go in for an exam.  During early labor, you may be able  to rest and manage symptoms at home. Some strategies to try at home include: ? Breathing and relaxation techniques. ? Taking a warm bath or shower. ? Listening to music. ? Using a heating pad on the lower back for pain. If you are directed to use heat:  Place a towel between your skin and the heat source.  Leave the heat on for 20-30 minutes.  Remove the heat if your skin turns bright red. This is especially important if you are unable to feel pain, heat, or cold. You may have a greater risk of getting burned.   Contact a health care provider if:  Your labor has started.  Your water breaks. Get help right away if:  You have painful, regular contractions that are 5 minutes apart or less.  Labor starts before you are [redacted] weeks along in your pregnancy.  You have a fever.  You have bright red blood coming from your vagina.  You do not feel your baby moving.  You have a severe headache with or without vision problems.  You have severe nausea, vomiting, or diarrhea.  You have chest pain or shortness of breath. These symptoms may represent a serious problem that is an emergency. Do not wait to see if the symptoms will go away. Get medical help right away. Call your local emergency services (911 in the [place] Do not drive yourself to the hospital. Summary  Labor is your body's natural process of moving your baby and the placenta out of your uterus.  The process of labor usually starts when your baby is full-term, between 36 and 40 weeks of pregnancy.  When labor starts, or if your water breaks, call your health care provider or nurse care line. Based on your situation, they will determine when you should go in for an exam. This information is not intended to replace advice given to you by your health care provider. Make sure you discuss any questions you have with your health care provider. Document Revised: 05/05/2020 Document Reviewed: 05/05/2020 Elsevier Patient Education  2021  Elsevier Inc.        Gestational Diabetes Mellitus, Self-Care Caring for yourself after a diagnosis of gestational diabetes mellitus means keeping your blood sugar under control. This can be done through nutrition, exercise, lifestyle changes, insulin and other medicines, and support from your health care team. Your health care provider will set individualized treatment goals for you. What are the risks? If left untreated, gestational diabetes can cause problems for mother and baby. For the mother Women who get gestational diabetes are more likely to:  Have labor induced and deliver early.  Have problems during labor and delivery, if the baby is larger than normal. This includes difficult labor and damage to the birth canal.  Have a cesarean delivery.  Have problems with blood pressure, including high blood pressure and preeclampsia.  Get it again if they become pregnant.  Develop type 2 diabetes in the future. For the baby Gestational diabetes that is not treated can cause the baby to have:  Low blood glucose (hypoglycemia).  Larger-than-normal body  size (macrosomia).  Breathing problems. How to monitor blood glucose Check your blood glucose every day and as often as told by your health care provider. To do this: 1. Wash your hands with soap and water for at least 20 seconds. 2. Prick the side of your finger (not the tip) with the lancet. Use a different finger each time. 3. Gently rub the finger until a small drop of blood appears. 4. Follow instructions that come with your meter for inserting the test strip, applying blood to the strip, and getting the result. 5. Write down your result and any notes. Blood glucose goals are:  95 mg/dL (5.3 mmol/L) when fasting.  140 mg/dL (7.8 mmol/L) 1 hour after a meal.  120 mg/dL (6.7 mmol/L) 2 hours after a meal.   Follow these instructions at home: Medicines  Take over-the-counter and prescription medicines only as told by  your health care provider.  If your health care provider prescribed insulin or other diabetes medicines: ? Take them every day. ? Do not run out of insulin or other medicines. Plan ahead so you always have them available. Eating and drinking  Follow instructions from your health care provider about eating or drinking restrictions.  See a diet and nutrition expert (registered dietician) to help you create an eating plan that helps control your diabetes. The foods in this plan will include: ? Lean proteins. ? Complex carbohydrates. These are carbohydrates that contain fiber, have a lot of nutrients, and are digested slowly. They include dried beans, nuts, and whole grain breads, cereals, or pasta. ? Fresh fruits and vegetables. ? Low-fat dairy products. ? Healthy fats.  Eat healthy snacks between nutritious meals.  Drink enough fluid to keep your urine pale yellow.  Keep a record of the carbohydrates that you eat. To do this: ? Read food labels. ? Learn the standard serving sizes of foods.  Make a sick day plan with your health care provider before you get sick. Follow this plan whenever you cannot eat or drink as usual.   Activity  Do exercises as told by your health care provider.  Do 30 or more minutes of physical activity a day, or as much physical activity as your health care provider recommends. It may help to control blood glucose levels after a meal if you: ? Do 10 minutes of exercise after each meal. ? Start this exercise 30 minutes after the meal.  If you start a new exercise or activity, work with your health care provider to adjust your insulin, other medicines, or food as needed. Lifestyle  Do not drink alcohol.  Do not use any products that contain nicotine or tobacco, such as cigarettes, e-cigarettes, and chewing tobacco. If you need help quitting, ask your health care provider.  Learn to manage stress. If you need help with this, ask your health care  provider. Body care  Keep your vaccines up to date.  Practice good oral hygiene. To do this: ? Clean your teeth and gums two times a day. ? Floss one or more times a day. ? Visit your dentist one or more times every 6 months.  Stay at a healthy weight while you are pregnant. Your expected weight gain depends on your BMI (body mass index) before pregnancy. General instructions  Talk with your health care provider about the risk for high blood pressure during pregnancy (preeclampsia and eclampsia).  Share your diabetes management plan with people in your workplace, school, and household.  Check your urine for  ketones when sick and as told by your health care provider. Ketones are made by the liver when a lack of glucose forces the body to use fat for energy.  Carry a medical alert card or wear medical alert jewelry that says you have gestational diabetes.  Keep all follow-up visits. This is important. Get care after delivery  Have your blood glucose level checked with an oral glucose tolerance test (OGTT) 4-12 weeks after delivery.  Get screened for diabetes at least every 3 years, or as often as told by your health care provider. Where to find more information  American Diabetes Association (ADA): diabetes.org  Association of Diabetes Care & Education Specialists (ADCES): diabeteseducator.org  Centers for Disease Control and Prevention (CDC): TonerPromos.no  American Pregnancy Association: americanpregnancy.org  U.S. Department of Agriculture MyPlate: WrestlingReporter.dk Contact a health care provider if:  Your blood glucose is above your target for two tests in a row.  You have a fever.  You have been sick for 2 days or more and are not getting better.  You have either of these problems for more than 6 hours: ? Vomiting every time you eat or drink. ? Diarrhea. Get help right away if you:  Become confused or cannot think clearly.  Have trouble breathing.  Have moderate or high  ketones in your urine.  Feel your baby is not moving as usual.  Develop unusual discharge or bleeding from your vagina.  Start having early (premature) contractions. Contractions may feel like a tightening in your lower abdomen  Have a severe headache. These symptoms may represent a serious problem that is an emergency. Do not wait to see if the symptoms will go away. Get medical help right away. Call your local emergency services (911 in the [place] Do not drive yourself to the hospital. Summary  Check your blood glucose every day during your pregnancy. Do this as often as told by your health care provider.  Take insulin or other diabetes medicines every day, if your health care provider prescribed them.  Have your blood glucose level checked 4-12 weeks after delivery.  Keep all follow-up visits. This is important. This information is not intended to replace advice given to you by your health care provider. Make sure you discuss any questions you have with your health care provider. Document Revised: 12/19/2019 Document Reviewed: 12/19/2019 Elsevier Patient Education  2021 [employer].

## 2020-12-12 NOTE — Progress Notes (Signed)
Subjective:  Mary Shepherd is a 25 y.o. G1P0 at [redacted]w[redacted]d being seen today for ongoing prenatal care.  She is currently monitored for the following issues for this high-risk pregnancy and has Supervision of normal pregnancy, antepartum; Thalassemia alpha carrier; Gestational diabetes mellitus (GDM) affecting pregnancy, antepartum; and Language barrier on their problem list.  Patient reports no complaints.  Contractions: Not present. Vag. Bleeding: None.  Movement: Present. Denies leaking of fluid.   The following portions of the patient's history were reviewed and updated as appropriate: allergies, current medications, past family history, past medical history, past social history, past surgical history and problem list. Problem list updated.  Objective:   Vitals:   12/12/20 1610  BP: 123/82  Pulse: 89  Weight: 141 lb (64 kg)    Fetal Status: Fetal Heart Rate (bpm): 155 Fundal Height: 36 cm Movement: Present     General:  Alert, oriented and cooperative. Patient is in no acute distress.  Skin: Skin is warm and dry. No rash noted.   Cardiovascular: Normal heart rate noted  Respiratory: Normal respiratory effort, no problems with respiration noted  Abdomen: Soft, gravid, appropriate for gestational age. Pain/Pressure: Present     Pelvic: Vag. Bleeding: None     Cervical exam deferred        Extremities: Normal range of motion.     Mental Status: Normal mood and affect. Normal behavior. Normal judgment and thought content.   Urinalysis:      Assessment and Plan:  Pregnancy: G1P0 at [redacted]w[redacted]d  1. Gestational diabetes mellitus (GDM) affecting pregnancy, antepartum -fasting: 82-86 -Breakfast: 84-129, elevated x1 @129  -Lunch: 84 - 127, elevated x3 @125 , 127, 121 -Dinner: 96 - 123, elevated x1 @123  -total #of elevations: 5, 16% of numbers, discussed walking after meals, especially lunch, recheck numbers next week -growth scheduled 12/19/2020  2. Supervision of normal first pregnancy,  antepartum  3. Thalassemia alpha carrier -pt declined GC  4. Language barrier - interpreter used for entire visit  5. [redacted] weeks gestation of pregnancy  Term labor symptoms and general obstetric precautions including but not limited to vaginal bleeding, contractions, leaking of fluid and fetal movement were reviewed in detail with the patient. I discussed the assessment and treatment plan with the patient. The patient was provided an opportunity to ask questions and all were answered. The patient agreed with the plan and demonstrated an understanding of the instructions. The patient was advised to call back or seek an in-person office evaluation/go to MAU at [facility] for any urgent or concerning symptoms. Please refer to After Visit Summary for other counseling recommendations.  Return in about 1 week (around 12/19/2020) for in-person HOB/APP OK.   Crestina Strike, [place] NP

## 2020-12-12 NOTE — Progress Notes (Signed)
Pt states her blood sugars have been normal.

## 2020-12-18 ENCOUNTER — Other Ambulatory Visit: Payer: Self-pay

## 2020-12-18 ENCOUNTER — Ambulatory Visit (INDEPENDENT_AMBULATORY_CARE_PROVIDER_SITE_OTHER): Payer: 59 | Admitting: Certified Nurse Midwife

## 2020-12-18 VITALS — BP 123/77 | HR 88 | Wt 142.0 lb

## 2020-12-18 DIAGNOSIS — Z3A38 38 weeks gestation of pregnancy: Secondary | ICD-10-CM

## 2020-12-18 DIAGNOSIS — O24410 Gestational diabetes mellitus in pregnancy, diet controlled: Secondary | ICD-10-CM

## 2020-12-18 DIAGNOSIS — Z3403 Encounter for supervision of normal first pregnancy, third trimester: Secondary | ICD-10-CM

## 2020-12-18 NOTE — Progress Notes (Signed)
   PRENATAL VISIT NOTE  Subjective:  Mary Shepherd is a 25 y.o. G1P0 at [redacted]w[redacted]d being seen today for ongoing prenatal care.  She is currently monitored for the following issues for this low-risk pregnancy and has Supervision of normal pregnancy, antepartum; Thalassemia alpha carrier; Gestational diabetes mellitus (GDM) affecting pregnancy, antepartum; and Language barrier on their problem list.  Patient reports no complaints.  Contractions: Not present. Vag. Bleeding: None.  Movement: Present. Denies leaking of fluid.   The following portions of the patient's history were reviewed and updated as appropriate: allergies, current medications, past family history, past medical history, past social history, past surgical history and problem list.   Objective:   Vitals:   12/18/20 1544  BP: 123/77  Pulse: 88  Weight: 142 lb (64.4 kg)    Fetal Status: Fetal Heart Rate (bpm): 153 Fundal Height: 33 cm Movement: Present  Presentation: Vertex  General:  Alert, oriented and cooperative. Patient is in no acute distress.  Skin: Skin is warm and dry. No rash noted.   Cardiovascular: Normal heart rate noted  Respiratory: Normal respiratory effort, no problems with respiration noted  Abdomen: Soft, gravid, appropriate for gestational age.  Pain/Pressure: Absent     Pelvic: Cervical exam deferred        Extremities: Normal range of motion.  Edema: None  Mental Status: Normal mood and affect. Normal behavior. Normal judgment and thought content.   Assessment and Plan:  Pregnancy: G1P0 at [redacted]w[redacted]d 1. Encounter for supervision of low-risk first pregnancy in third trimester - Doing well, feeling regular and vigorous fetal movement  2. [redacted] weeks gestation of pregnancy - Routine OB care - S<D which is a new finding, previous visits have been consistent with dates. Has U/S scheduled for tomorrow already  3. Diet controlled gestational diabetes mellitus (GDM) in third trimester - All glucose readings WNL.  Encouraged to keep going with daily walks and whatever dietary changes she made to stay in such good control. Pt verbalized understanding. - Will schedule IOL for A1GDM at 40wks, form filled out and orders signed/held  Term labor symptoms and general obstetric precautions including but not limited to vaginal bleeding, contractions, leaking of fluid and fetal movement were reviewed in detail with the patient. Please refer to After Visit Summary for other counseling recommendations.   Return in about 1 week (around 12/25/2020) for IN-PERSON, LOB.  Future Appointments  Date Time Provider Department Center  12/19/2020  3:30 PM [facility] NURSE [facility]  12/19/2020  3:45 PM [facility] US4 [facility]  12/25/2020  4:10 PM Rasch, Harolyn Rutherford, NP [facility] None    Bernerd Limbo, CNM

## 2020-12-19 ENCOUNTER — Ambulatory Visit: Payer: 59 | Attending: Obstetrics

## 2020-12-19 ENCOUNTER — Other Ambulatory Visit: Payer: Self-pay | Admitting: Advanced Practice Midwife

## 2020-12-19 ENCOUNTER — Ambulatory Visit: Payer: 59 | Admitting: *Deleted

## 2020-12-19 ENCOUNTER — Encounter: Payer: Self-pay | Admitting: *Deleted

## 2020-12-19 DIAGNOSIS — Z362 Encounter for other antenatal screening follow-up: Secondary | ICD-10-CM

## 2020-12-19 DIAGNOSIS — Z3A38 38 weeks gestation of pregnancy: Secondary | ICD-10-CM

## 2020-12-19 DIAGNOSIS — Z148 Genetic carrier of other disease: Secondary | ICD-10-CM

## 2020-12-19 DIAGNOSIS — O24410 Gestational diabetes mellitus in pregnancy, diet controlled: Secondary | ICD-10-CM | POA: Diagnosis not present

## 2020-12-19 DIAGNOSIS — O24419 Gestational diabetes mellitus in pregnancy, unspecified control: Secondary | ICD-10-CM

## 2020-12-25 ENCOUNTER — Other Ambulatory Visit: Payer: Self-pay

## 2020-12-25 ENCOUNTER — Ambulatory Visit (INDEPENDENT_AMBULATORY_CARE_PROVIDER_SITE_OTHER): Payer: 59 | Admitting: Obstetrics and Gynecology

## 2020-12-25 VITALS — BP 123/82 | HR 89 | Wt 144.0 lb

## 2020-12-25 DIAGNOSIS — Z789 Other specified health status: Secondary | ICD-10-CM

## 2020-12-25 DIAGNOSIS — O24419 Gestational diabetes mellitus in pregnancy, unspecified control: Secondary | ICD-10-CM

## 2020-12-25 DIAGNOSIS — Z603 Acculturation difficulty: Secondary | ICD-10-CM

## 2020-12-25 DIAGNOSIS — Z3490 Encounter for supervision of normal pregnancy, unspecified, unspecified trimester: Secondary | ICD-10-CM

## 2020-12-25 NOTE — Progress Notes (Signed)
She would like to be check today for dilation.

## 2020-12-25 NOTE — Progress Notes (Signed)
   PRENATAL VISIT NOTE  Subjective:  Mary Shepherd is a 25 y.o. G1P0 at [redacted]w[redacted]d being seen today for ongoing prenatal care.  She is currently monitored for the following issues for this low-risk pregnancy and has Supervision of normal pregnancy, antepartum; Thalassemia alpha carrier; Gestational diabetes mellitus (GDM) affecting pregnancy, antepartum; and Language barrier on their problem list.  Patient reports no complaints.  Contractions: Not present. Vag. Bleeding: None.  Movement: Present. Denies leaking of fluid.   The following portions of the patient's history were reviewed and updated as appropriate: allergies, current medications, past family history, past medical history, past social history, past surgical history and problem list.   Objective:   Vitals:   12/25/20 1604  BP: 123/82  Pulse: 89  Weight: 144 lb (65.3 kg)    Fetal Status: Fetal Heart Rate (bpm): 133 Fundal Height: 39 cm Movement: Present  Presentation: Undeterminable  General:  Alert, oriented and cooperative. Patient is in no acute distress.  Skin: Skin is warm and dry. No rash noted.   Cardiovascular: Normal heart rate noted  Respiratory: Normal respiratory effort, no problems with respiration noted  Abdomen: Soft, gravid, appropriate for gestational age.  Pain/Pressure: Absent     Pelvic: Cervical exam performed in the presence of a chaperone        Extremities: Normal range of motion.  Edema: None  Mental Status: Normal mood and affect. Normal behavior. Normal judgment and thought content.   Assessment and Plan:  Pregnancy: G1P0 at [redacted]w[redacted]d  1. Encounter for supervision of normal pregnancy, antepartum, unspecified gravidity  Induction scheduled for June 4 Cervix closed, discussed starting evening primrose oil OTC GBS negative   2. Language barrier  Interpretor used.  3. Gestational diabetes mellitus (GDM) affecting pregnancy, antepartum  All BS for the last 10 days are WNL Last growth [place] @ 37 weeks shows  baby is 2932 grams   Term labor symptoms and general obstetric precautions including but not limited to vaginal bleeding, contractions, leaking of fluid and fetal movement were reviewed in detail with the patient. Please refer to After Visit Summary for other counseling recommendations.   No follow-ups on file.  Future Appointments  Date Time Provider Department Center  12/29/2020 12:00 AM [facility] SCHED ROOM [facility] None    Venia Carbon, NP

## 2020-12-25 NOTE — Patient Instructions (Signed)

## 2020-12-27 ENCOUNTER — Other Ambulatory Visit (HOSPITAL_COMMUNITY): Payer: 59 | Attending: Family Medicine

## 2020-12-27 ENCOUNTER — Telehealth (HOSPITAL_COMMUNITY): Payer: Self-pay | Admitting: *Deleted

## 2020-12-27 NOTE — Telephone Encounter (Signed)
Preadmission screen Interpreter number [phone]

## 2020-12-29 ENCOUNTER — Encounter (HOSPITAL_COMMUNITY): Payer: Self-pay | Admitting: Family Medicine

## 2020-12-29 ENCOUNTER — Inpatient Hospital Stay (HOSPITAL_COMMUNITY): Payer: 59

## 2020-12-29 ENCOUNTER — Inpatient Hospital Stay (HOSPITAL_COMMUNITY)
Admission: AD | Admit: 2020-12-29 | Discharge: 2020-12-31 | DRG: 805 | Disposition: A | Discharge status: Home / Self Care | Payer: 59 | Attending: Obstetrics and Gynecology | Admitting: Obstetrics and Gynecology

## 2020-12-29 ENCOUNTER — Other Ambulatory Visit: Payer: Self-pay

## 2020-12-29 DIAGNOSIS — U071 COVID-19: Secondary | ICD-10-CM | POA: Diagnosis present

## 2020-12-29 DIAGNOSIS — Z3A40 40 weeks gestation of pregnancy: Secondary | ICD-10-CM

## 2020-12-29 DIAGNOSIS — O9081 Anemia of the puerperium: Secondary | ICD-10-CM | POA: Diagnosis not present

## 2020-12-29 DIAGNOSIS — O24420 Gestational diabetes mellitus in childbirth, diet controlled: Secondary | ICD-10-CM | POA: Diagnosis present

## 2020-12-29 DIAGNOSIS — O9852 Other viral diseases complicating childbirth: Secondary | ICD-10-CM | POA: Diagnosis present

## 2020-12-29 DIAGNOSIS — D62 Acute posthemorrhagic anemia: Secondary | ICD-10-CM | POA: Diagnosis not present

## 2020-12-29 DIAGNOSIS — D563 Thalassemia minor: Secondary | ICD-10-CM | POA: Diagnosis present

## 2020-12-29 DIAGNOSIS — O98513 Other viral diseases complicating pregnancy, third trimester: Secondary | ICD-10-CM | POA: Diagnosis present

## 2020-12-29 DIAGNOSIS — Z3490 Encounter for supervision of normal pregnancy, unspecified, unspecified trimester: Secondary | ICD-10-CM

## 2020-12-29 DIAGNOSIS — O24419 Gestational diabetes mellitus in pregnancy, unspecified control: Secondary | ICD-10-CM | POA: Diagnosis present

## 2020-12-29 DIAGNOSIS — Z789 Other specified health status: Secondary | ICD-10-CM | POA: Diagnosis present

## 2020-12-29 DIAGNOSIS — O26893 Other specified pregnancy related conditions, third trimester: Secondary | ICD-10-CM | POA: Diagnosis present

## 2020-12-29 LAB — CBC
HCT: 36.5 % (ref 36.0–46.0)
Hemoglobin: 11.1 g/dL — ABNORMAL LOW (ref 12.0–15.0)
MCH: 22.3 pg — ABNORMAL LOW (ref 26.0–34.0)
MCHC: 30.4 g/dL (ref 30.0–36.0)
MCV: 73.4 fL — ABNORMAL LOW (ref 80.0–100.0)
Platelets: 223 10*3/uL (ref 150–400)
RBC: 4.97 MIL/uL (ref 3.87–5.11)
RDW: 14.6 % (ref 11.5–15.5)
WBC: 12.8 10*3/uL — ABNORMAL HIGH (ref 4.0–10.5)
nRBC: 0 % (ref 0.0–0.2)

## 2020-12-29 LAB — GLUCOSE, CAPILLARY
Glucose-Capillary: 85 mg/dL (ref 70–99)
Glucose-Capillary: 123 mg/dL — ABNORMAL HIGH (ref 70–99)
Glucose-Capillary: 96 mg/dL (ref 70–99)

## 2020-12-29 LAB — RPR: RPR Ser Ql: NONREACTIVE

## 2020-12-29 LAB — TYPE AND SCREEN
ABO/RH(D): B POS
Antibody Screen: NEGATIVE

## 2020-12-29 LAB — RESP PANEL BY RT-PCR (FLU A&B, COVID) ARPGX2
Influenza A by PCR: NEGATIVE
Influenza B by PCR: NEGATIVE
SARS Coronavirus 2 by RT PCR: POSITIVE — AB

## 2020-12-29 MED ORDER — LIDOCAINE HCL (PF) 1 % IJ SOLN
30.0000 mL | INTRAMUSCULAR | Status: AC | PRN
  Administered 2020-12-29: 30 mL via SUBCUTANEOUS
  Filled 2020-12-29: qty 30

## 2020-12-29 MED ORDER — COCONUT OIL OIL: 1.0000 "application " | TOPICAL_OIL | Status: DC | PRN

## 2020-12-29 MED ORDER — DIPHENHYDRAMINE HCL 25 MG PO CAPS: 25.0000 mg | ORAL_CAPSULE | Freq: Four times a day (QID) | ORAL | Status: DC | PRN

## 2020-12-29 MED ORDER — SOD CITRATE-CITRIC ACID 500-334 MG/5ML PO SOLN: 30.0000 mL | ORAL | Status: DC | PRN

## 2020-12-29 MED ORDER — ACETAMINOPHEN 325 MG PO TABS: 650.0000 mg | ORAL_TABLET | ORAL | Status: DC | PRN

## 2020-12-29 MED ORDER — ONDANSETRON HCL 4 MG/2ML IJ SOLN: 4.0000 mg | INTRAMUSCULAR | Status: DC | PRN

## 2020-12-29 MED ORDER — OXYCODONE-ACETAMINOPHEN 5-325 MG PO TABS: 2.0000 | ORAL_TABLET | ORAL | Status: DC | PRN

## 2020-12-29 MED ORDER — ONDANSETRON HCL 4 MG/2ML IJ SOLN: 4.0000 mg | Freq: Four times a day (QID) | INTRAMUSCULAR | Status: DC | PRN

## 2020-12-29 MED ORDER — OXYCODONE-ACETAMINOPHEN 5-325 MG PO TABS: 1.0000 | ORAL_TABLET | ORAL | Status: DC | PRN

## 2020-12-29 MED ORDER — TERBUTALINE SULFATE 1 MG/ML IJ SOLN
0.2500 mg | Freq: Once | INTRAMUSCULAR | Status: DC | PRN
Start: 2020-12-29 — End: 2020-12-29

## 2020-12-29 MED ORDER — WITCH HAZEL-GLYCERIN EX PADS: 1.0000 "application " | MEDICATED_PAD | CUTANEOUS | Status: DC | PRN

## 2020-12-29 MED ORDER — ONDANSETRON HCL 4 MG PO TABS: 4.0000 mg | ORAL_TABLET | ORAL | Status: DC | PRN

## 2020-12-29 MED ORDER — DIBUCAINE (PERIANAL) 1 % EX OINT: 1.0000 "application " | TOPICAL_OINTMENT | CUTANEOUS | Status: DC | PRN

## 2020-12-29 MED ORDER — OXYTOCIN-SODIUM CHLORIDE 30-0.9 UT/500ML-% IV SOLN: 2.5000 [IU]/h | INTRAVENOUS | Status: DC

## 2020-12-29 MED ORDER — FENTANYL CITRATE (PF) 100 MCG/2ML IJ SOLN
100.0000 ug | INTRAMUSCULAR | Status: DC | PRN
  Administered 2020-12-29: 50 ug via INTRAVENOUS
  Filled 2020-12-29: qty 2

## 2020-12-29 MED ORDER — PRENATAL MULTIVITAMIN CH
1.0000 | ORAL_TABLET | Freq: Every day | ORAL | Status: DC
  Administered 2020-12-30 – 2020-12-31 (×2): 1 via ORAL
  Filled 2020-12-29 (×2): qty 1

## 2020-12-29 MED ORDER — OXYTOCIN-SODIUM CHLORIDE 30-0.9 UT/500ML-% IV SOLN
1.0000 m[IU]/min | INTRAVENOUS | Status: DC
  Administered 2020-12-29: 2 m[IU]/min via INTRAVENOUS
  Filled 2020-12-29: qty 500

## 2020-12-29 MED ORDER — BENZOCAINE-MENTHOL 20-0.5 % EX AERO
1.0000 "application " | INHALATION_SPRAY | CUTANEOUS | Status: DC | PRN
  Administered 2020-12-30: 1 via TOPICAL
  Filled 2020-12-29: qty 56

## 2020-12-29 MED ORDER — SENNOSIDES-DOCUSATE SODIUM 8.6-50 MG PO TABS
2.0000 | ORAL_TABLET | Freq: Every day | ORAL | Status: DC
  Administered 2020-12-30 – 2020-12-31 (×2): 2 via ORAL
  Filled 2020-12-29 (×2): qty 2

## 2020-12-29 MED ORDER — TERBUTALINE SULFATE 1 MG/ML IJ SOLN: 0.2500 mg | Freq: Once | INTRAMUSCULAR | Status: DC | PRN

## 2020-12-29 MED ORDER — OXYTOCIN BOLUS FROM INFUSION
333.0000 mL | Freq: Once | INTRAVENOUS | Status: AC
  Administered 2020-12-29: 333 mL via INTRAVENOUS

## 2020-12-29 MED ORDER — SIMETHICONE 80 MG PO CHEW
80.0000 mg | CHEWABLE_TABLET | ORAL | Status: DC | PRN
Start: 2020-12-29 — End: 2020-12-31
  Filled 2020-12-29: qty 1

## 2020-12-29 MED ORDER — LACTATED RINGERS IV SOLN: INTRAVENOUS | Status: DC

## 2020-12-29 MED ORDER — IBUPROFEN 600 MG PO TABS
600.0000 mg | ORAL_TABLET | Freq: Four times a day (QID) | ORAL | Status: DC
  Administered 2020-12-30 – 2020-12-31 (×7): 600 mg via ORAL
  Filled 2020-12-29 (×7): qty 1

## 2020-12-29 MED ORDER — TETANUS-DIPHTH-ACELL PERTUSSIS 5-2.5-18.5 LF-MCG/0.5 IM SUSY: 0.5000 mL | PREFILLED_SYRINGE | Freq: Once | INTRAMUSCULAR | Status: DC

## 2020-12-29 MED ORDER — ZOLPIDEM TARTRATE 5 MG PO TABS: 5.0000 mg | ORAL_TABLET | Freq: Every evening | ORAL | Status: DC | PRN

## 2020-12-29 MED ORDER — MISOPROSTOL 50MCG HALF TABLET
50.0000 ug | ORAL_TABLET | ORAL | Status: DC | PRN
  Administered 2020-12-29: 50 ug via BUCCAL
  Filled 2020-12-29: qty 1

## 2020-12-29 MED ORDER — LACTATED RINGERS IV SOLN: 500.0000 mL | INTRAVENOUS | Status: DC | PRN

## 2020-12-29 NOTE — Progress Notes (Signed)
Labor Progress Note Mary Shepherd is a 25 y.o. G1P0 at [redacted]w[redacted]d presented for IOL A1GDM (well controlled), baby 16%ile at [redacted]w[redacted]d.  S:  Pt resting in bed, FOB at bedside for support. No complaints, having mild contractions that are at times close together, not yet quite regular. Interpreter present via [employer] video services.  O:  BP (!) 137/97   Pulse 84   Temp 98.3 F (36.8 C) (Oral)   Resp 17   Ht 4' 11.06" (1.5 m)   Wt 143 lb 4.8 oz (65 kg)   LMP 03/24/2020   BMI 28.89 kg/m  EFM: baseline 140 bpm/ moderate variability/ 15x15 accels/ no decels  Toco/IUPC: q6-25min SVE: Dilation: 1 Effacement (%): 80 Cervical Position: Middle Station: -2 Presentation: Vertex Exam by:: CNM  A/P: 25 y.o. G1P0 [redacted]w[redacted]d  1. Labor: latent 2. FWB: cat 1 3. Pain: well controlled with FOB support and deep breathing, desires unmedicated delivery 4. Will re-dose 50mg  buccal cytotec  Anticipate SVD.  , CNM 11:40 AM

## 2020-12-29 NOTE — Discharge Summary (Signed)
Postpartum Discharge Summary    Patient Name: Mary Shepherd DOB: [birth year] MRN: [id number]  Date of admission: 12/29/2020 Delivery date:12/29/2020  Delivering provider: Wende Mott  Date of discharge: 12/31/2020  Admitting diagnosis: Indication for care in labor or delivery [O75.9] Intrauterine pregnancy: [redacted]w[redacted]d    Secondary diagnosis:  Principal Problem:   Vaginal delivery Active Problems:   Thalassemia alpha carrier   Gestational diabetes mellitus (GDM) affecting pregnancy, antepartum   Language barrier   Indication for care in labor or delivery   COVID-19 affecting pregnancy in third trimester   Acute blood loss anemia  Additional problems: as noted above    Discharge diagnosis: Term Pregnancy Delivered and GDM A1                                              Post partum procedures:IV venofer Augmentation: Pitocin, Cytotec and IP Foley Complications: None  Hospital course: Induction of Labor With Vaginal Delivery   25y.o. yo G1P0 at 434w0das admitted to the hospital 12/29/2020 for induction of labor.  Indication for induction: A1 DM.  Patient had an uncomplicated labor course as follows: Membrane Rupture Time/Date: 2:31 PM ,12/29/2020   Delivery Method:Vaginal, Spontaneous  Episiotomy: None  Lacerations:  2nd degree;Labial  Details of delivery can be found in separate delivery note.  Patient had a routine postpartum course. Patient is discharged home 12/31/20.  Newborn Data: Birth date:12/29/2020  Birth time:6:31 PM  Gender:Female  Living status:Living  Apgars:8 ,9  Weight:2920 g   Magnesium Sulfate received: No BMZ received: No Rhophylac:N/A MMR:N/A T-DaP: offered prior to discharge Flu: offered prior to discharge Transfusion:Yes (IV venofer)  Physical exam  Vitals:   12/30/20 0958 12/30/20 1428 12/30/20 2233 12/31/20 0542  BP: 116/74 116/73 114/75 113/82  Pulse: 80 82 89 82  Resp: _0 Temp: 98 F (36.7 C) 98.1 F (36.7 C) 98.3 F (36.8 C) 97.7 F  (36.5 C)  TempSrc:  Oral Axillary Axillary  SpO2: 100% 100% 100% 100%  Weight:      Height:       General: alert, cooperative and no distress Lochia: appropriate Uterine Fundus: firm Incision: N/A DVT Evaluation: No evidence of DVT seen on physical exam. No cords or calf tenderness. No significant calf/ankle edema. Labs: Lab Results  Component Value Date   WBC 18.8 (H) 12/30/2020   HGB 8.4 (L) 12/30/2020   HCT 26.7 (L) 12/30/2020   MCV 72.6 (L) 12/30/2020   PLT 174 12/30/2020   No flowsheet data found. Edinburgh Score: Edinburgh Postnatal Depression Scale Screening Tool 12/30/2020  I have been able to laugh and see the funny side of things. 0  I have looked forward with enjoyment to things. 0  I have blamed myself unnecessarily when things went wrong. 2  I have been anxious or worried for no good reason. 0  I have felt scared or panicky for no good reason. 0  Things have been getting on top of me. 1  I have been so unhappy that I have had difficulty sleeping. 0  I have felt sad or miserable. 1  I have been so unhappy that I have been crying. 1  The thought of harming myself has occurred to me. 0  Edinburgh Postnatal Depression Scale Total 5     After visit meds:  Allergies as of 12/31/2020  No Known Allergies     Medication List    STOP taking these medications   Accu-Chek Guide test strip Generic drug: glucose blood   Accu-Chek Softclix Lancets lancets     TAKE these medications   acetaminophen 325 MG tablet Commonly known as: Tylenol Take 2 tablets (650 mg total) by mouth every 6 (six) hours as needed for mild pain, moderate pain, fever or headache (for pain scale < 4).   coconut oil Oil Apply 1 application topically as needed (nipple pain).   ferrous sulfate 325 (65 FE) MG tablet Take 1 tablet (325 mg total) by mouth daily.   ibuprofen 600 MG tablet Commonly known as: ADVIL Take 1 tablet (600 mg total) by mouth every 8 (eight) hours as needed for  moderate pain or cramping.   norethindrone 0.35 MG tablet Commonly known as: Ortho Micronor Take 1 tablet (0.35 mg total) by mouth daily.   PRENATAL VITAMINS PO Take by mouth.        Discharge home in stable condition Infant Feeding: Breast Infant Disposition:home with mother Discharge instruction: per After Visit Summary and Postpartum booklet. Activity: Advance as tolerated. Pelvic rest for 6 weeks.  Diet: carb modified diet Future Appointments:No future appointments. Follow up Visit:  Please schedule this patient for a In person postpartum visit in 4 weeks with the following provider: Any provider. Additional Postpartum F/U:2 hour GTT  High risk pregnancy complicated by: GDM Delivery mode:  Vaginal, Spontaneous  Anticipated Birth Control:  POPs  Miasia Crabtree, Gildardo Cranker, MD OB Fellow, Faculty [facility] 12/31/2020 5:54 AM

## 2020-12-29 NOTE — Progress Notes (Signed)
.  Labor Progress Note Mary Shepherd is a 25 y.o. G1P0 at [redacted]w[redacted]d presented for IOL A1GDM (well controlled), baby 16%ile at [redacted]w[redacted]d.  S:  Pt switching between resting in bed and ambulating to bathroom and around room. FOB at bedside for support.   O:  BP 138/86   Pulse (!) 101   Temp 98.1 F (36.7 C)   Resp 18   Ht 4' 11.06" (1.5 m)   Wt 143 lb 4.8 oz (65 kg)   LMP 03/24/2020   SpO2 98%   BMI 28.89 kg/m  EFM: baseline 140 bpm/ moderate variability/ 15x15 accels/ no decels  Toco/IUPC: q2-79min SVE: 4/90/-1  FB out, Pitocin titration to start  A/P: 25 y.o. G1P0 [redacted]w[redacted]d  1. Labor: active 2. FWB: cat 1 3. Pain: well controlled with FOB support and deep breathing 4. Will begin Pitocin titration.  Anticipate SVD.  Edd Arbour, CNM, MSN, IBCLC Certified Nurse Midwife, [facility]

## 2020-12-29 NOTE — Progress Notes (Signed)
Pt pushing spontanously.  CNM at [facility], room ready for delivery.

## 2020-12-29 NOTE — H&P (Signed)
OBSTETRIC ADMISSION HISTORY AND PHYSICAL  Arnie Zeitlin is a 25 y.o. female G1P0 with IUP at [redacted]w[redacted]d by LMP presenting for IOL. She reports +FMs, No LOF, no VB, no blurry vision, headaches or peripheral edema, and RUQ pain.  She plans on bottle feeding. She request POPs for birth control. She [facility]   Dating: By LMP --->  Estimated Date of Delivery: 12/29/20  Sono:    @[redacted]w[redacted]d , CWD, normal anatomy, cephalic presentation, 2932g, EFW   Prenatal History/Complications:  -Alpha Thal Carrier -A1GDM - well controlled    Past Medical History: No past medical history on file.  Past Surgical History: No past surgical history on file.  Obstetrical History: OB History    Gravida  1   Para      Term      Preterm      AB      Living        SAB      IAB      Ectopic      Multiple      Live Births              Social History Social History   Socioeconomic History  . Marital status: Married    Spouse name: Not on file  . Number of children: Not on file  . Years of education: Not on file  . Highest education level: Not on file  Occupational History  . Not on file  Tobacco Use  . Smoking status: Never Smoker  . Smokeless tobacco: Never Used  Vaping Use  . Vaping Use: Never used  Substance and Sexual Activity  . Alcohol use: Never  . Drug use: Never  . Sexual activity: Yes    Partners: Male  Other Topics Concern  . Not on file  Social History Narrative  . Not on file   Social Determinants of Health   Financial Resource Strain: Not on file  Food Insecurity: Not on file  Transportation Needs: Not on file  Physical Activity: Not on file  Stress: Not on file  Social Connections: Not on file    Family History: Family History  Problem Relation Age of Onset  . Healthy Mother   . Healthy Father     Allergies: No Known Allergies  Medications Prior to Admission  Medication Sig Dispense Refill Last Dose  . Accu-Chek Softclix  Lancets lancets Use as instructed; please check blood glucose 4 times daily. 100 each 6   . glucose blood (ACCU-CHEK GUIDE) test strip Use as instructed; please check blood glucose 4 times daily. 100 each 6   . Prenatal Vit-Fe Fumarate-FA (PRENATAL VITAMINS PO) Take by mouth.        Review of Systems   All systems reviewed and negative except as stated in HPI  Height 4' 11.06" (1.5 m), weight 65 kg, last menstrual period 03/24/2020. General appearance: alert, cooperative and appears stated age Lungs: clear to auscultation bilaterally Heart: regular rate and rhythm Abdomen: soft, non-tender; bowel sounds normal   Extremities: Homans sign is negative, no sign of DVT Presentation: cephalic by 03/26/2020 Fetal monitoring baseline 135, mod variability, pos accels, no decels  Uterine activityNone     Prenatal labs: ABO, Rh: --/--/PENDING (06/04 03-14-1975) Antibody: PENDING (06/04 0629) Rubella: 30.50 (01/24 1030) RPR: Non Reactive (03/22 0944)  HBsAg: Negative (01/24 1030)  HIV: Non Reactive (03/22 0944)  GBS: Negative/-- (05/10 0502)  2 hr Glucola abnormal Genetic screening  Low risk NIPS  Anatomy US normal   Prenatal Transfer Tool  Maternal Diabetes: Yes:  Diabetes Type:  Diet controlled Genetic Screening: Normal, alpha thal carrier Maternal Ultrasounds/Referrals: Normal Fetal Ultrasounds or other Referrals:  Referred to Materal Fetal Medicine  Maternal Substance Abuse:  No Significant Maternal Medications:  None Significant Maternal Lab Results: Group B Strep negative  Results for orders placed or performed during the hospital encounter of 12/29/20 (from the past 24 hour(s))  Type and screen   Collection Time: 12/29/20  6:29 AM  Result Value Ref Range   ABO/RH(D) PENDING    Antibody Screen PENDING    Sample Expiration      01/01/2021,2359 Performed at [facility], [address] [place], [place] [address]     Patient Active Problem List   Diagnosis Date Noted  .  Indication for care in labor or delivery 12/29/2020  . Language barrier 11/13/2020  . Gestational diabetes mellitus (GDM) affecting pregnancy, antepartum 10/17/2020  . Thalassemia alpha carrier 09/17/2020  . Supervision of normal pregnancy, antepartum 08/20/2020    Assessment/Plan:  Lizette Dusenbury is a 25 y.o. G1P0 at [redacted]w[redacted]d here for IOL for A1GDM  #Induction of Labor: Discussed induction methods with patient and partner with use of video interpreter. Start with buccal cytotec, foley balloon when able.  #Pain: Per patient request #FWB: Cat I  #ID:  gbs neg #MOF: breast #MOC: unsure, thinking about POP's #A1GDM: cbg q4hr, q2 in active labor   Gita Kudo, MD  12/29/2020, 6:59 AM

## 2020-12-29 NOTE — Progress Notes (Signed)
.  Labor Progress Note Mary Shepherd is a 25 y.o. G1P0 at [redacted]w[redacted]d presented for IOL A1GDM (well controlled), baby 16%ile at [redacted]w[redacted]d.  S:  Pt resting in bed, using deep breathing and FOB at bedside for support. Feeling much more uncomfortable with contractions.  O:  BP 138/86   Pulse (!) 101   Temp 98.1 F (36.7 C)   Resp 18   Ht 4' 11.06" (1.5 m)   Wt 143 lb 4.8 oz (65 kg)   LMP 03/24/2020   SpO2 98%   BMI 28.89 kg/m  EFM: baseline 140 bpm/ moderate variability/ 15x15 accels/ no decels  Toco/IUPC: q2-46min SVE: 6/90/-1  Making progress, contraction pattern near tachysystole and baby having early repetitive decels. Pitocin titration stopped. Decels less repetitive and contraction pattern remains adequate and q2-80min.  A/P: 25 y.o. G1P0 [redacted]w[redacted]d  1. Labor: active 2. FWB: cat 1 3. Pain: well controlled with FOB support and deep breathing 4. Pitocin titration stopped. Expectant management.  Anticipate SVD.  Edd Arbour, CNM, MSN, IBCLC Certified Nurse Midwife, [facility]

## 2020-12-30 LAB — CBC
HCT: 26.7 % — ABNORMAL LOW (ref 36.0–46.0)
Hemoglobin: 8.4 g/dL — ABNORMAL LOW (ref 12.0–15.0)
MCH: 22.8 pg — ABNORMAL LOW (ref 26.0–34.0)
MCHC: 31.5 g/dL (ref 30.0–36.0)
MCV: 72.6 fL — ABNORMAL LOW (ref 80.0–100.0)
Platelets: 174 10*3/uL (ref 150–400)
RBC: 3.68 MIL/uL — ABNORMAL LOW (ref 3.87–5.11)
RDW: 14.5 % (ref 11.5–15.5)
WBC: 18.8 10*3/uL — ABNORMAL HIGH (ref 4.0–10.5)
nRBC: 0 % (ref 0.0–0.2)

## 2020-12-30 MED ORDER — SODIUM CHLORIDE 0.9 % IV SOLN
500.0000 mg | Freq: Once | INTRAVENOUS | Status: AC
  Administered 2020-12-30: 500 mg via INTRAVENOUS
  Filled 2020-12-30: qty 25

## 2020-12-30 NOTE — Progress Notes (Signed)
Post Partum Day 1 Subjective: no complaints, up ad lib, voiding, tolerating PO and + flatus.  Objective: Blood pressure 116/74, pulse 80, temperature 98 F (36.7 C), resp. rate 16, height 4' 11.06" (1.5 m), weight 65 kg, last menstrual period 03/24/2020, SpO2 100 %, unknown if currently breastfeeding.  Physical Exam:  General: alert, cooperative and no distress Lochia: inappropriate (see below for additional details) Uterine Fundus: firm Incision: n/a DVT Evaluation: No evidence of DVT seen on physical exam. Negative Homan's sign. No cords or calf tenderness. No significant calf/ankle edema.  Recent Labs    12/29/20 0629 12/30/20 0515  HGB 11.1* 8.4*  HCT 36.5 26.7*    Assessment/Plan: Plan for discharge tomorrow and Contraception POPs. On pad check, more bleeding than usual visualized, some bright red. No clots noted, no active bleeding visualized. RN called and requested pad check and monitoring for bleeding. Patient hgb 8.5 this morning without PPH. Will monitor for s/s of abnormal bleeding. Patient reports feeling well today without dizziness or other concerns and reports feeling well. [place] interpreter used for entire visit.   LOS: 1 day   Odie Sera Kaushik Maul 12/30/2020, 11:25 AM

## 2020-12-30 NOTE — Progress Notes (Signed)
Ipad was used in [place] to explain plan of care

## 2020-12-30 NOTE — Lactation Note (Signed)
This note was copied from a baby's chart. Lactation Consultation Note Baby 6 hrs old at time of consult. Used interpreter Daryl Eastern # Mom has GDM. Mom has given formula to keep baby's glucose levels elevated. Baby has no interest in BF at this time. Baby latched a couple of times w/a few suckles then stopped. LC taught hand expression and spoon fed baby colostrum.  Suck training w/gloved finger, baby was very tight and biting.  Mom shone how to DEBP. Mom pumped w/a drop of colostrum noted. Rubbed colostrum on baby's gums. Milk storage, newborn feeding habits, STS, I&O, positioning discussed. Mom stated she has no questions at this time.  Patient Name: Mary Shepherd [id number] Date: 12/30/2020 Reason for consult: Initial assessment;Primapara;Term;Maternal endocrine disorder Age:20 hours  Maternal Data Has patient been taught Hand Expression?: Yes Does the patient have breastfeeding experience prior to this delivery?: No  Feeding Mother's Current Feeding Choice: Breast Milk and Formula Nipple Type: Slow - flow  LATCH Score Latch: Too sleepy or reluctant, no latch achieved, no sucking elicited.  Audible Swallowing: None  Type of Nipple: Everted at rest and after stimulation (short shaft)  Comfort (Breast/Nipple): Soft / non-tender  Hold (Positioning): Full assist, staff holds infant at breast  LATCH Score: 4   Lactation Tools Discussed/Used Tools: Shells;Pump Breast pump type: Double-Electric Breast Pump Pump Education: Setup, frequency, and cleaning;Milk Storage Reason for Pumping: short shaft Pumping frequency: Q 3 hr  Interventions Interventions: Breast feeding basics reviewed;Support pillows;Assisted with latch;Position options;Skin to skin;Expressed milk;Breast massage;Hand express;Shells;Pre-pump if needed;DEBP;Breast compression;Adjust position  Discharge [facility] Program: Yes  Consult Status Consult Status: Follow-up Date: 12/30/20 Follow-up type: In-patient    Helen Winterhalter,  Diamond Nickel 12/30/2020, 1:04 AM

## 2020-12-31 DIAGNOSIS — D62 Acute posthemorrhagic anemia: Secondary | ICD-10-CM

## 2020-12-31 MED ORDER — NORETHINDRONE 0.35 MG PO TABS: 1.0000 | ORAL_TABLET | Freq: Every day | ORAL | 6 refills | Status: DC

## 2020-12-31 MED ORDER — ACETAMINOPHEN 325 MG PO TABS: 650.0000 mg | ORAL_TABLET | Freq: Four times a day (QID) | ORAL | Status: AC | PRN

## 2020-12-31 MED ORDER — FERROUS SULFATE 325 (65 FE) MG PO TABS: 325.0000 mg | ORAL_TABLET | Freq: Every day | ORAL | 2 refills | Status: AC

## 2020-12-31 MED ORDER — COCONUT OIL OIL: 1.0000 "application " | TOPICAL_OIL | 0 refills | Status: AC | PRN

## 2020-12-31 MED ORDER — IBUPROFEN 600 MG PO TABS: 600.0000 mg | ORAL_TABLET | Freq: Three times a day (TID) | ORAL | 0 refills | Status: AC | PRN

## 2020-12-31 NOTE — Lactation Note (Signed)
This note was copied from a baby's chart. Lactation Consultation Note  Patient Name: Mary Shepherd Date: 12/31/2020 Reason for consult: Follow-up assessment;Mother's request;Difficult latch;Primapara;1st time breastfeeding;Term (Covid positive) Age:25 years  LC talked with Mother and Father with help [place] translator, Tresa Endo [phone].   Mom had some extended times between feedings and small volumes based on hrs of age since delivery. Infant mostly offered bottles between 10-30 ml. Last feeding at 12 pm, Mom gave 10 ml. Parents are preparing to leave so we reviewed feeding volumes to supplement when infant latching vs bottle feeding only.   Parents are offering more formula since last feeding volume was insufficient given infant 24 hrs old.   Mom to call [facility] on the way home to get an electric pump. LC assessed current flange size of 24 as comfortable fit. Mom to use manual pump for now pumping q 3hrs for 10 minutes each breast.   Plan 1. To feed based on cues 8-12x in 24 hr period no more than 3 hrs without an attempt. Mom to offer both breasts and look for signs of milk transfer.         2. Mom to supplement based on breast feeding supplementation guide provided. Mom aware if infant not latching at the breast to offer more based on volume via bottle only of her EBM first then formula.        3. Mom to pump using manual as stated above until she gets a pump from [facility].   Mom showed me the number for [facility] on her cell phone and states they will call on the way home.   All questions answered at the end of the visit.   Mom follow up appointment with Pediatrician on Wednesday.  LC also sent an email to outpatient clinic to reach out to mother for further [facility] support.   Maternal Data    Feeding Mother's Current Feeding Choice: Breast Milk and Formula Nipple Type: Slow - flow  LATCH Score                    Lactation Tools Discussed/Used Tools: Pump;Flanges;Shells;Comfort  gels (Mom stated discomfort with nipple from hand expression with staff. She denies any pain with latching or with the use of the pump flanges.comfort gels provided for pain, no abrasions or bruise noted. Mom to wear and rinse in between use, d/c after 6 days) Flange Size: 24 Breast pump type: Manual Pump Education: Setup, frequency, and cleaning;Milk Storage Reason for Pumping: increase stimulation Pumping frequency: every 3 hrs 10 minutes each breast.  Interventions Interventions: Breast feeding basics reviewed;Skin to skin;Education;Pre-pump if needed;Shells;Comfort gels;Hand pump  Discharge Discharge Education: Engorgement and breast care;Warning signs for feeding baby;Outpatient recommendation Pump: Manual  Consult Status Consult Status: Complete Date: 12/31/20    Sajan Cheatwood  Nicholson-Springer 12/31/2020, 2:22 PM

## 2021-02-11 ENCOUNTER — Ambulatory Visit (INDEPENDENT_AMBULATORY_CARE_PROVIDER_SITE_OTHER): Payer: 59 | Admitting: Advanced Practice Midwife

## 2021-02-11 ENCOUNTER — Other Ambulatory Visit: Payer: 59

## 2021-02-11 ENCOUNTER — Other Ambulatory Visit: Payer: Self-pay

## 2021-02-11 ENCOUNTER — Encounter: Payer: Self-pay | Admitting: Advanced Practice Midwife

## 2021-02-11 DIAGNOSIS — Z3009 Encounter for other general counseling and advice on contraception: Secondary | ICD-10-CM | POA: Diagnosis not present

## 2021-02-11 DIAGNOSIS — Z8632 Personal history of gestational diabetes: Secondary | ICD-10-CM | POA: Diagnosis not present

## 2021-02-11 MED ORDER — NORETHINDRONE 0.35 MG PO TABS: 1.0000 | ORAL_TABLET | Freq: Every day | ORAL | 11 refills | Status: AC

## 2021-02-11 NOTE — Addendum Note (Signed)
Addended by: Sharen Counter A on: 02/11/2021 10:58 AM   Modules accepted: Orders

## 2021-02-11 NOTE — Progress Notes (Signed)
Post Partum Visit Note  Mary Shepherd is a 25 y.o. G66P1001 female who presents for a postpartum visit. She is 5 weeks postpartum following a normal spontaneous vaginal delivery.  I have fully reviewed the prenatal and intrapartum course. The delivery was at [redacted] weeks gestational weeks.  Anesthesia: local. Postpartum course has been uncomplicated. Baby is doing well. Baby is feeding by breast. Bleeding no bleeding. Bowel function is normal. Bladder function is normal. Patient is not sexually active. Contraception method is OCP (estrogen/progesterone). Postpartum depression screening: negative.   The pregnancy intention screening data noted above was reviewed. Potential methods of contraception were discussed. The patient elected to proceed with No data recorded.   Edinburgh Postnatal Depression Scale - 02/11/21 0902       Edinburgh Postnatal Depression Scale:  In the Past 7 Days   I have been able to laugh and see the funny side of things. 0    I have looked forward with enjoyment to things. 0    I have blamed myself unnecessarily when things went wrong. 0    I have been anxious or worried for no good reason. 0    I have felt scared or panicky for no good reason. 0    Things have been getting on top of me. 0    I have been so unhappy that I have had difficulty sleeping. 0    I have felt sad or miserable. 0    I have been so unhappy that I have been crying. 0    The thought of harming myself has occurred to me. 0    Edinburgh Postnatal Depression Scale Total 0             Health Maintenance Due  Topic Date Due   HEMOGLOBIN A1C  Never done   PNEUMOCOCCAL POLYSACCHARIDE VACCINE AGE 76-64 HIGH RISK  Never done   Pneumococcal Vaccine 76-5 Years old (1 - PCV) Never done   FOOT EXAM  Never done   OPHTHALMOLOGY EXAM  Never done   URINE MICROALBUMIN  Never done   HPV VACCINES (1 - 2-dose series) Never done   TETANUS/TDAP  Never done    The following portions of the patient's history  were reviewed and updated as appropriate: allergies, current medications, past family history, past medical history, past social history, past surgical history, and problem list.  Review of Systems Pertinent items noted in HPI and remainder of comprehensive ROS otherwise negative.  Objective:  BP 131/79   Pulse 80   Wt 129 lb 6.4 oz (58.7 kg)   LMP 03/24/2020   BMI 26.09 kg/m    VS reviewed, nursing note reviewed,  Constitutional: well developed, well nourished, no distress HEENT: normocephalic CV: normal rate Pulm/chest wall: normal effort Abdomen: soft Neuro: alert and oriented x 3 Skin: warm, dry Psych: affect normal   Assessment:  1. Postpartum care following vaginal delivery --doing well, no problems, bonding well with infant  2. History of gestational diabetes --2 hour GTT today  3. Encounter for counseling regarding contraception --Discussed LARCs as most effective forms of birth control.  Discussed benefits/risks of other methods.  Pt desires POPs.  Discussed failure rates of OCPs with regular use.   Currently breastfeeding.  Reviewed differences between POPs and OCPs and pt to notify office if/when she desires to switch.  - norethindrone (ORTHO MICRONOR) 0.35 MG tablet; Take 1 tablet (0.35 mg total) by mouth daily.  Dispense: 30 tablet; Refill: 11   normal postpartum  exam.   Plan:   Essential components of care per ACOG recommendations:  1.  Mood and well being: Patient with negative depression screening today. Reviewed local resources for support.  - Patient tobacco use? No.   - hx of drug use? No.    2. Infant care and feeding:  -Patient currently breastmilk feeding? Yes. Reviewed importance of draining breast regularly to support lactation.  -Social determinants of health (SDOH) reviewed in EPIC. No concerns  3. Sexuality, contraception and birth spacing - Patient does not want a pregnancy in the next year.   - Reviewed forms of contraception in tiered  fashion. Patient desired oral progesterone-only contraceptive today.   - Discussed birth spacing of 18 months  4. Sleep and fatigue -Encouraged family/partner/community support of 4 hrs of uninterrupted sleep to help with mood and fatigue  5. Physical Recovery  - Discussed patients delivery and complications. She describes her labor as good. - Patient had a Vaginal, no problems at delivery. Patient had a 2nd degree laceration. Perineal healing reviewed. Patient expressed understanding - Patient has urinary incontinence? No. - Patient is safe to resume physical and sexual activity  6.  Health Maintenance - HM due items addressed Yes - Last pap smear  Diagnosis  Date Value Ref Range Status  08/20/2020   Final   - Negative for intraepithelial lesion or malignancy (NILM)   Pap smear not done at today's visit.  -Breast Cancer screening indicated? No.   7. Chronic Disease/Pregnancy Condition follow up: Gestational Diabetes  - PCP follow up  Sharen Counter, CNM [facility] [employer], [facility]

## 2021-02-12 LAB — GLUCOSE TOLERANCE, 2 HOURS
Glucose, 2 hour: 155 mg/dL — ABNORMAL HIGH (ref 65–139)
Glucose, GTT - Fasting: 78 mg/dL (ref 65–99)

## 2021-02-27 ENCOUNTER — Telehealth: Payer: Self-pay

## 2021-02-27 NOTE — Telephone Encounter (Signed)
-----   Message from Hurshel Party, CNM sent at 02/21/2021 10:55 PM EDT ----- This postpartum patient has an elevated value on her 2 hour GTT, done on 02/11/21. She may have type 2 diabetes and needs to follow up with primary care.  Please call to let her know.  They will need to retest her in a few weeks/months to be sure.  We can give her a list of providers if she does not have a PCP. She will need a [place] interpreter.

## 2021-02-27 NOTE — Telephone Encounter (Signed)
TC to patient using video interpreter line. No answer or voice mail to leave a message.

## 2021-07-15 IMAGING — US US MFM OB COMP +14 WKS
1 series · 14 of 28 positions shown · non-contrast
Comparison: none

[Series 1: us mfm ob comp +14 wks · 79 acquisitions, 14 frames shown]
[im 3/79]
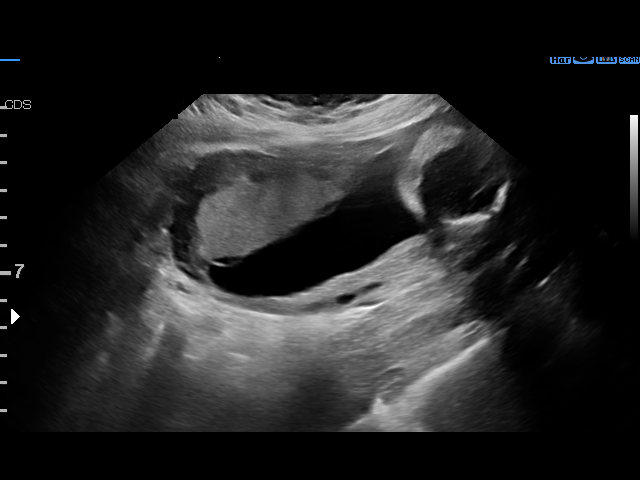
[im 9/79]
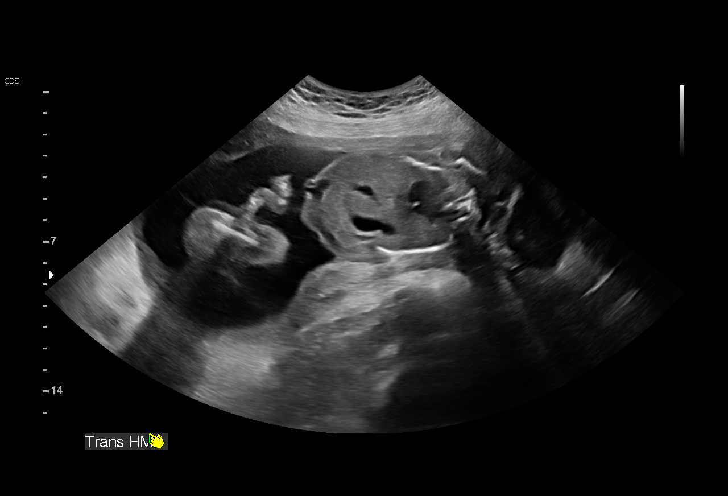
[im 15/79]
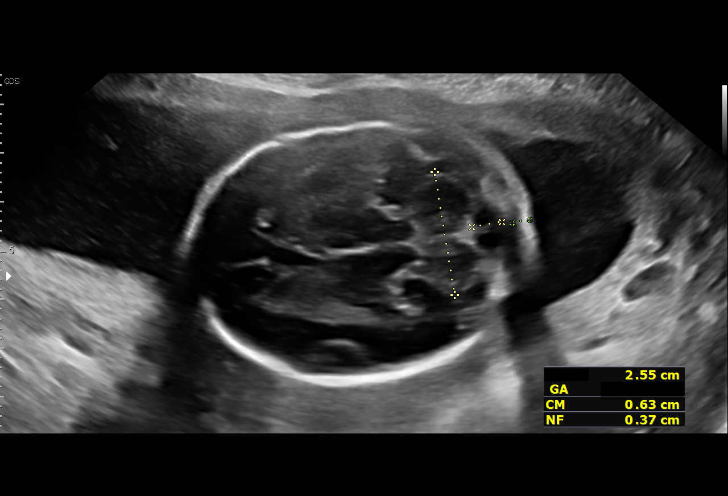
[im 21/79]
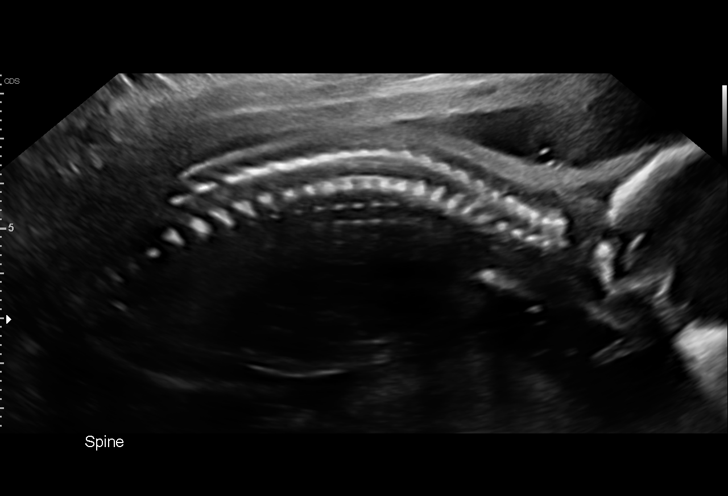
[im 27/79]
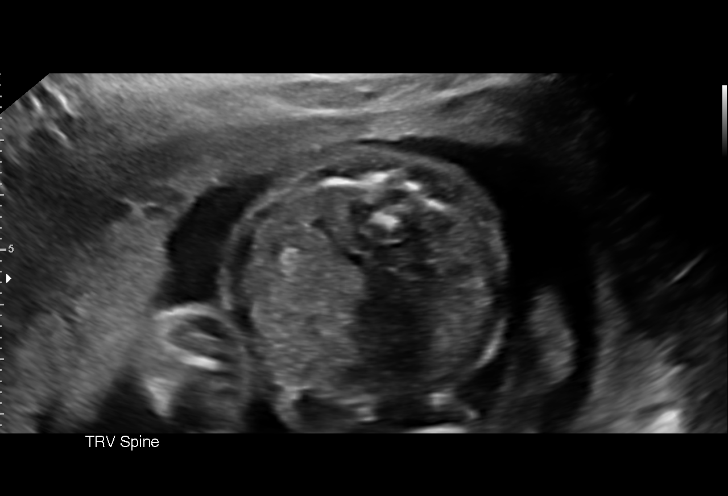
[im 32/79]
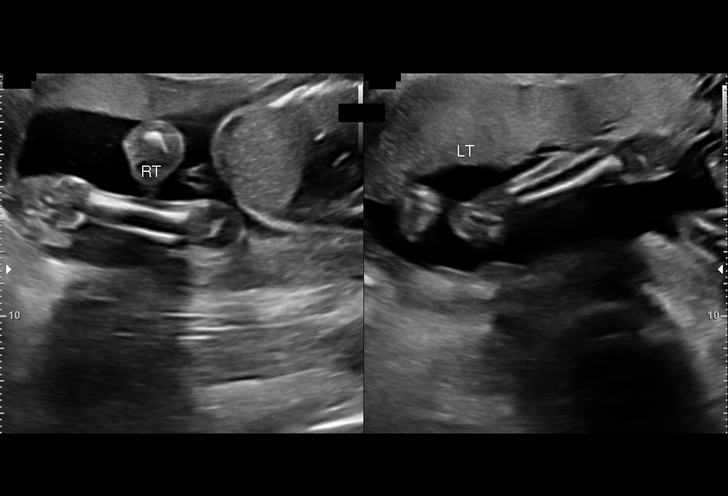
[im 38/79]
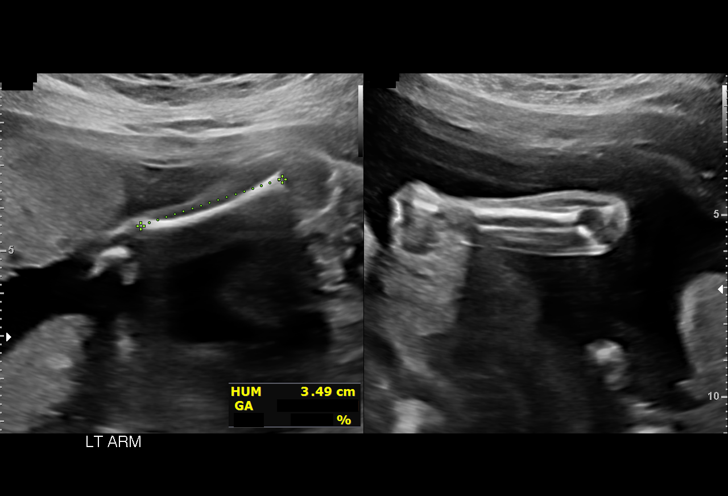
[im 44/79]
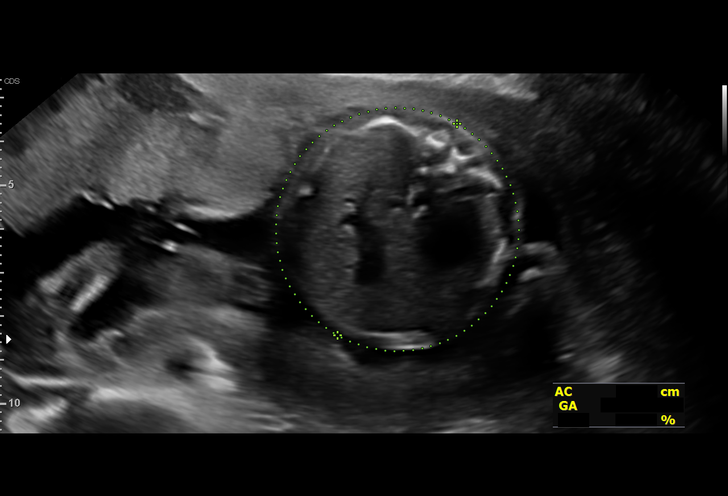
[im 50/79]
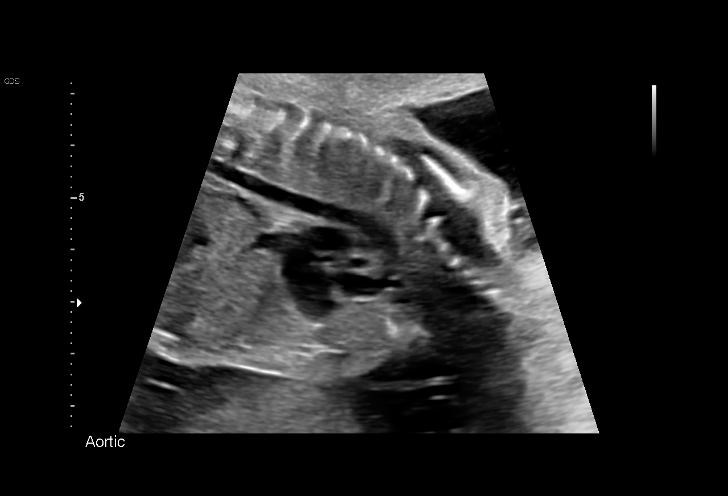
[im 55/79]
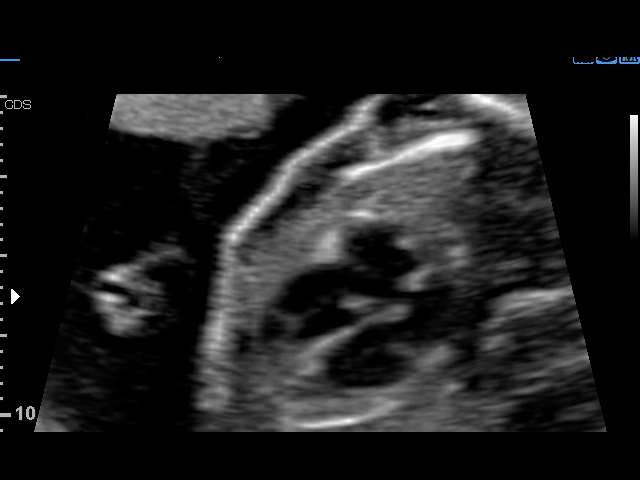
[im 61/79]
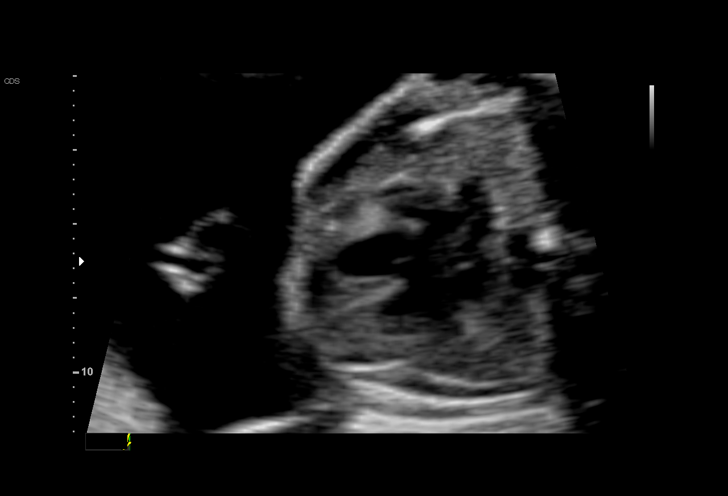
[im 67/79]
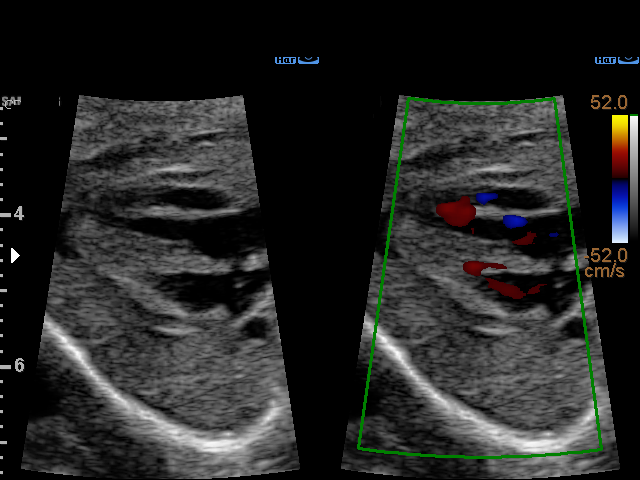
[im 73/79]
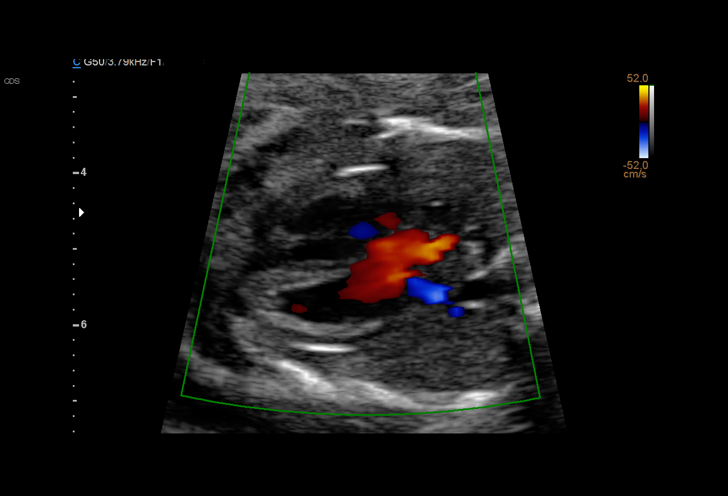
[im 79/79]
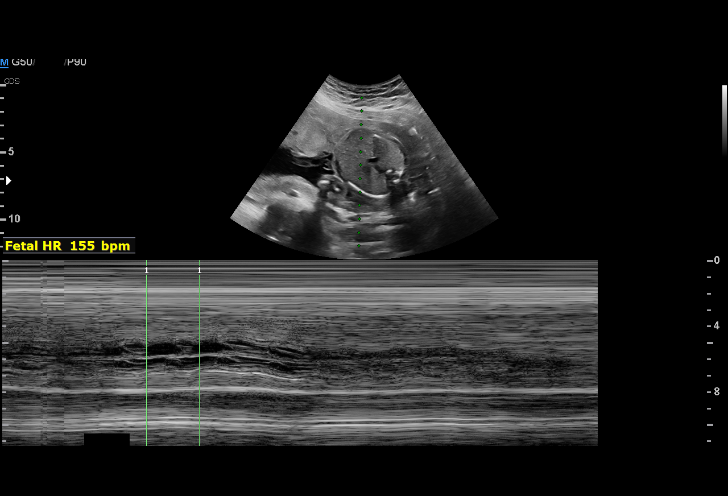

[14 of 28 positions shown; findings below may reference images not displayed]

Indications

 22 weeks gestation of pregnancy
 Encounter for antenatal screening,
 unspecified (LR NIPS)
Fetal Evaluation

 Num Of Fetuses:         1
 Fetal Heart Rate(bpm):  155
 Cardiac Activity:       Observed
 Presentation:           Transverse, head to maternal right
 Placenta:               Anterior Fundal
 P. Cord Insertion:      Visualized, central

 Amniotic Fluid
 AFI FV:      Within normal limits

                             Largest Pocket(cm)

Biometry

 BPD:      51.2  mm     G. Age:  21w 4d          7  %    CI:        69.05   %    70 - 86
                                                         FL/HC:      19.1   %    19.2 -
 HC:      196.8  mm     G. Age:  21w 6d          7  %    HC/AC:      1.13        1.05 -
 AC:      173.9  mm     G. Age:  22w 2d         25  %    FL/BPD:     73.4   %    71 - 87
 FL:       37.6  mm     G. Age:  22w 0d         15  %    FL/AC:      21.6   %    20 - 24
 HUM:      34.9  mm     G. Age:  22w 0d         25  %
 CER:      25.5  mm     G. Age:  23w 1d         85  %
 NFT:       3.7  mm
 LV:        5.2  mm
 CM:        6.3  mm

 Est. FW:     478  gm      1 lb 1 oz     14  %
OB History

 Gravidity:    1
Gestational Age

 Clinical EDD:  22w 6d                                        EDD:   12/29/20
 U/S Today:     22w 0d                                        EDD:   01/04/21
 Best:          22w 6d     Det. By:  Clinical EDD             EDD:   12/29/20
Anatomy

 Cranium:               Appears normal         Aortic Arch:            Appears normal
 Cavum:                 Appears normal         Ductal Arch:            Appears normal
 Ventricles:            Appears normal         Diaphragm:              Appears normal
 Choroid Plexus:        Appears normal         Stomach:                Appears normal, left
                                                                       sided
 Cerebellum:            Appears normal         Abdomen:                Appears normal
 Posterior Fossa:       Appears normal         Abdominal Wall:         Appears nml (cord
                                                                       insert, abd wall)
 Nuchal Fold:           Appears normal         Cord Vessels:           Appears normal (3
                                                                       vessel cord)
 Face:                  Appears normal         Kidneys:                Appear normal
                        (orbits and profile)
 Lips:                  Appears normal         Bladder:                Appears normal
 Thoracic:              Appears normal         Spine:                  Appears normal
 Heart:                 Appears normal         Upper Extremities:      Appears normal
                        (4CH, axis, and
                        situs)
 RVOT:                  Appears normal         Lower Extremities:      Appears normal
 LVOT:                  Appears normal
Cervix Uterus Adnexa

 Cervix
 Length:           3.88  cm.
 Normal appearance by transabdominal scan.

 Right Ovary
 Not visualized.

 Left Ovary
 Within normal limits.
Impression

 Single intrauterine pregnancy here for a detailed anatomy
 Normal anatomy with measurements consistent with dates
 There is good fetal movement and amniotic fluid volume

 Ms. Dennis Martin has a low risk NIPS and negative AFP.

 I reviewed today's examination with Ms. Dennis Martin she has no
 further questions.
Recommendations

 Follow up as clinically indicated.

## 2021-10-06 IMAGING — US US MFM OB FOLLOW-UP
1 series · 14 of 19 positions shown · non-contrast
Comparison: none

[Series 1: us mfm ob follow-up · 19 acquisitions, 14 frames shown]
[im 1/19]
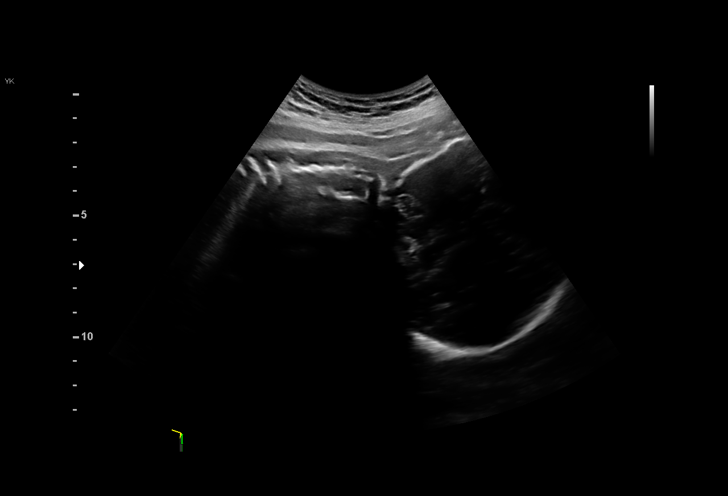
[im 3/19]
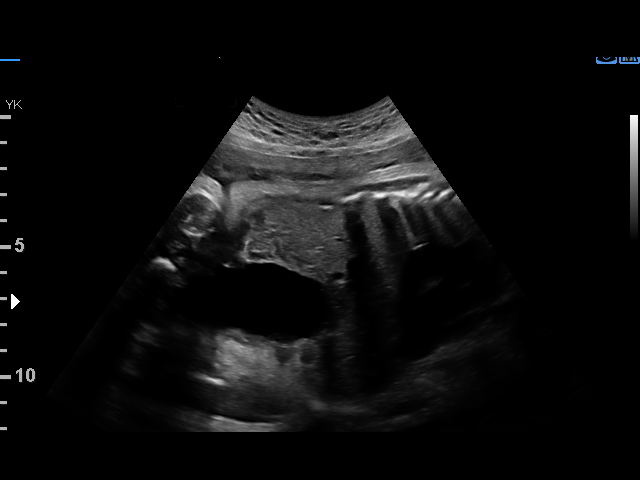
[im 4/19]
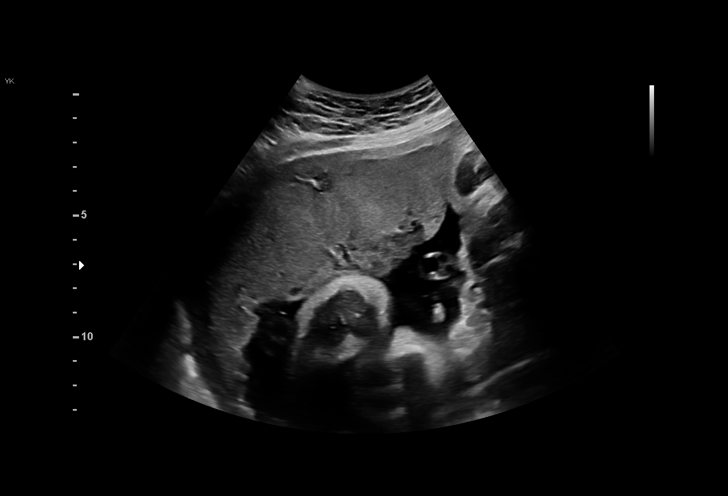
[im 5/19]
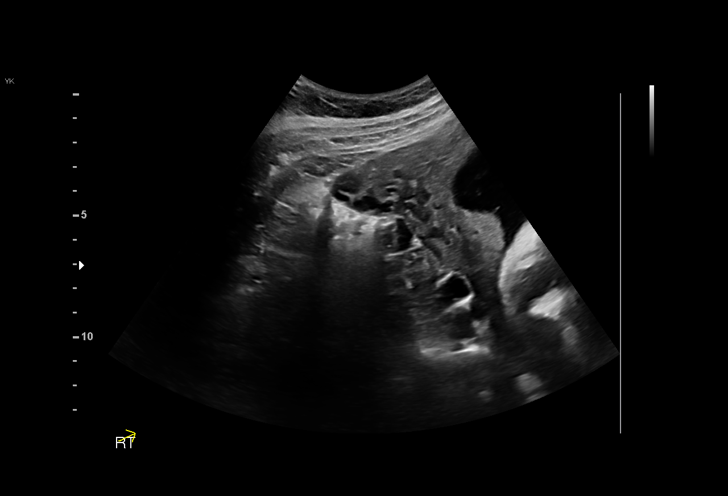
[im 7/19]
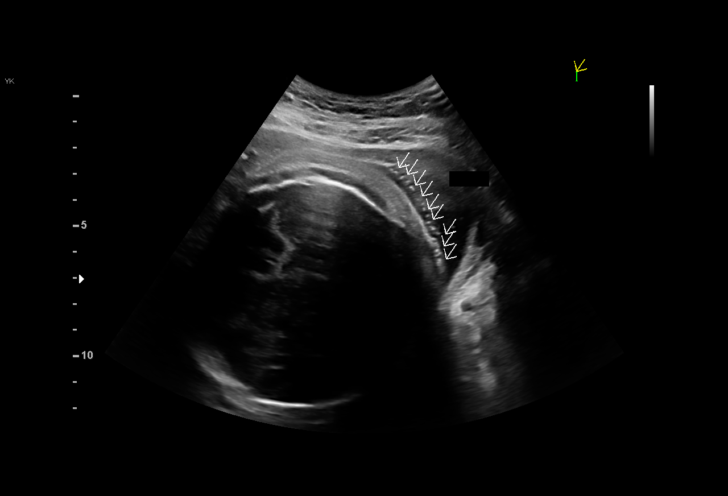
[im 8/19]
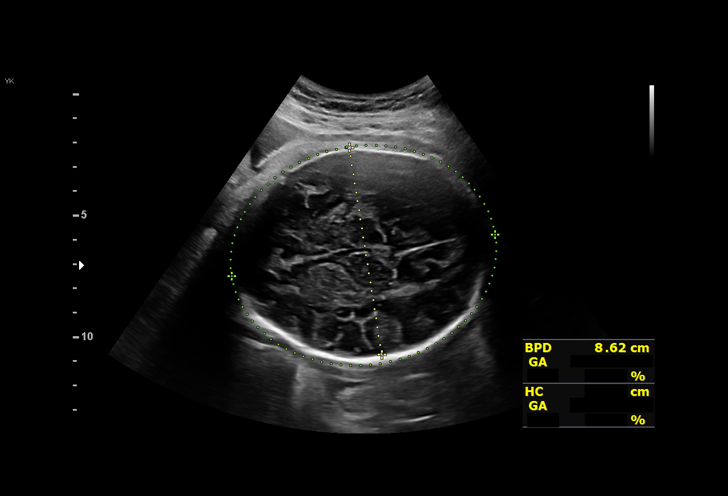
[im 9/19]
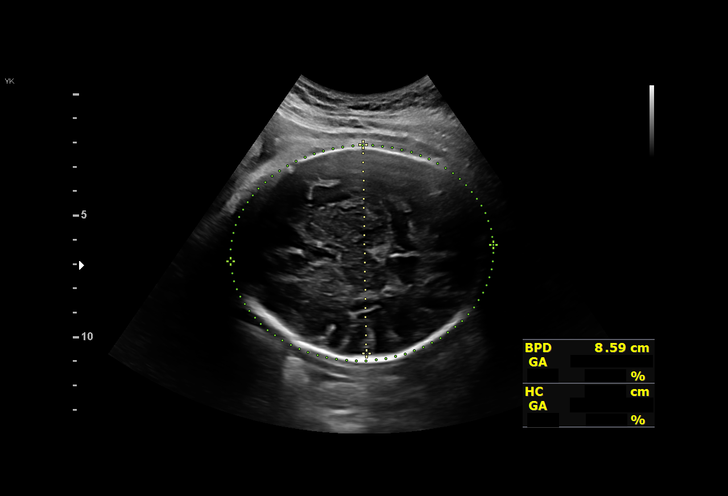
[im 11/19]
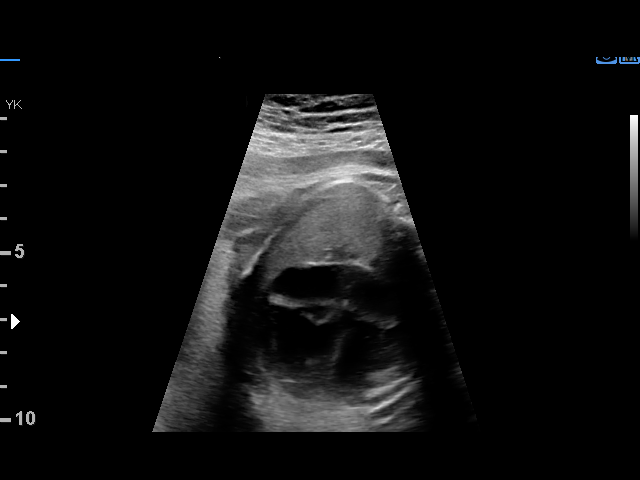
[im 12/19]
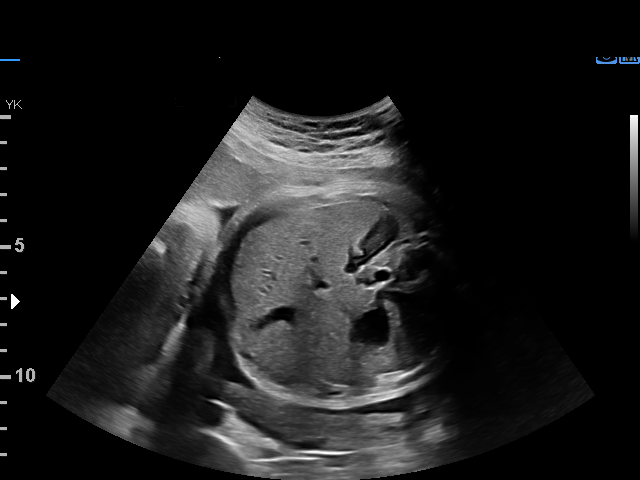
[im 13/19]
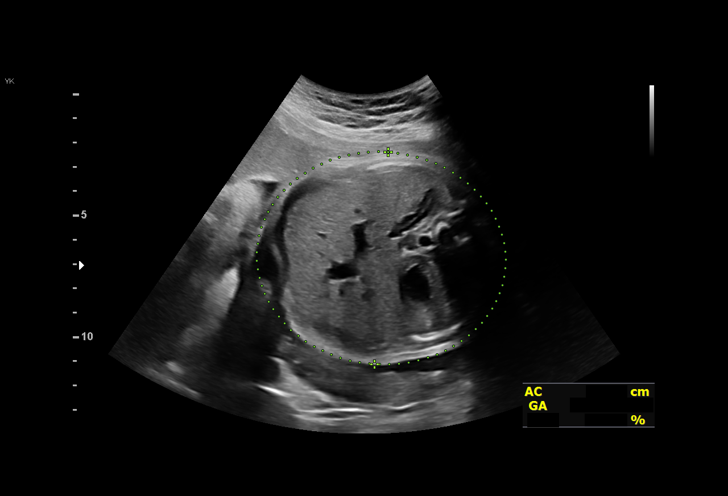
[im 15/19]
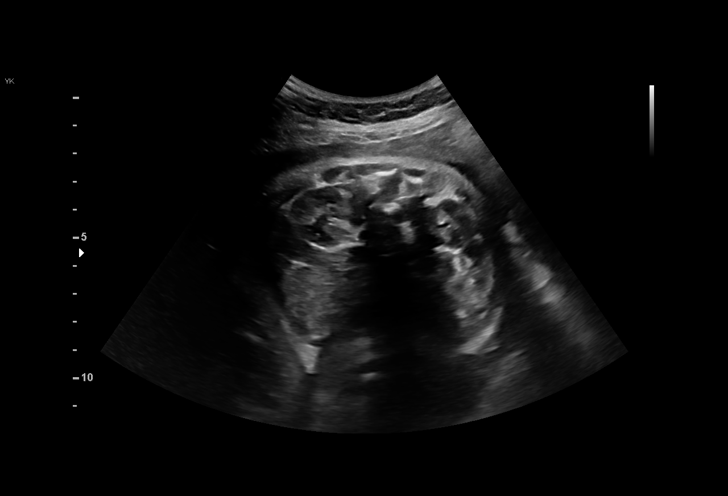
[im 16/19]
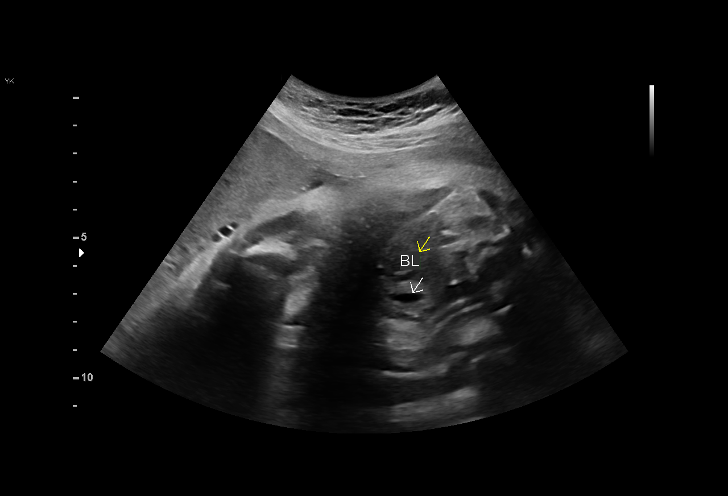
[im 17/19]
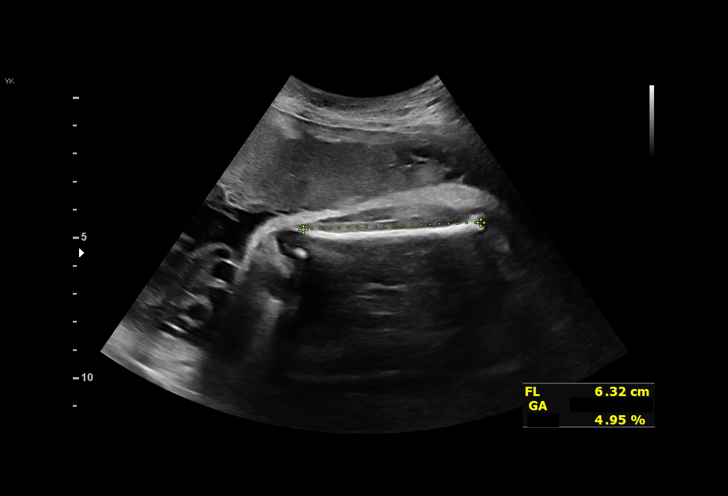
[im 19/19]
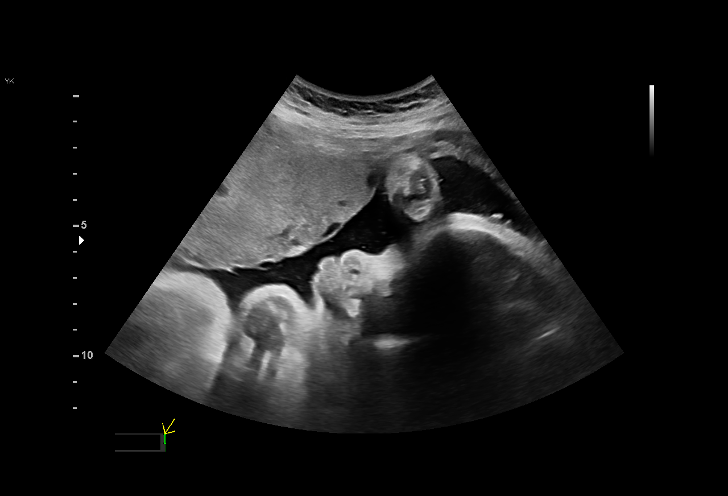

[14 of 19 positions shown; findings below may reference images not displayed]

GLEZER

Indications

 34 weeks gestation of pregnancy
 Gestational diabetes in pregnancy, diet
 controlled
Fetal Evaluation

 Num Of Fetuses:         1
 Fetal Heart Rate(bpm):  143
 Cardiac Activity:       Observed
 Presentation:           Cephalic
 Placenta:               Anterior
 P. Cord Insertion:      Previously Visualized

 Amniotic Fluid
 AFI FV:      Within normal limits

 AFI Sum(cm)     %Tile       Largest Pocket(cm)
 18.09           67

 RUQ(cm)       RLQ(cm)       LUQ(cm)        LLQ(cm)

Biophysical Evaluation

 Amniotic F.V:   Within normal limits       F. Tone:        Observed
 F. Movement:    Observed                   Score:          [DATE]
 F. Breathing:   Observed
Biometry

 BPD:        86  mm     G. Age:  34w 5d         50  %    CI:        75.86   %    70 - 86
                                                         FL/HC:      20.1   %    20.1 -
 HC:       313   mm     G. Age:  35w 0d         24  %    HC/AC:      1.06        0.93 -
 AC:      295.9  mm     G. Age:  33w 4d         23  %    FL/BPD:     73.0   %    71 - 87
 FL:       62.8  mm     G. Age:  32w 4d        3.7  %    FL/AC:      21.2   %    20 - 24

 Est. FW:    8808  gm    4 lb 14 oz      16  %
OB History

 Gravidity:    1
Gestational Age

 Clinical EDD:  34w 5d                                        EDD:   12/29/20
 U/S Today:     34w 0d                                        EDD:   01/03/21
 Best:          34w 5d     Det. By:  Clinical EDD             EDD:   12/29/20
Anatomy

 Cranium:               Previously seen        LVOT:                   Previously seen
 Cavum:                 Previously seen        Aortic Arch:            Previously seen
 Ventricles:            Previously seen        Ductal Arch:            Previously seen
 Choroid Plexus:        Previously seen        Diaphragm:              Previously seen
 Cerebellum:            Appears normal         Stomach:                Appears normal, left
                                                                       sided
 Posterior Fossa:       Previously seen        Abdomen:                Previously seen
 Nuchal Fold:           Previously seen        Abdominal Wall:         Previously seen
 Face:                  Orbits appear          Cord Vessels:           Previously seen
                        normal
 Lips:                  Previously seen        Kidneys:                Appear normal
 Palate:                Previously seen        Bladder:                Appears normal
 Thoracic:              Previously seen        Spine:                  Previously seen
 Heart:                 Appears normal         Upper Extremities:      Previously seen
                        (4CH, axis, and
                        situs)
 RVOT:                  Previously seen        Lower Extremities:      Previously seen

 Other:  VC, 3VV and 3VTV previously visualized.
Cervix Uterus Adnexa

 Cervix
 Not visualized (advanced GA >50wks)

 Right Ovary
 Within normal limits.

 Left Ovary
 Within normal limits.
Impression

 Gestational diabetes. Well-controlled on diet .
 Amniotic fluid is normal and good fetal activity is seen .Fetal
 biometry is consistent with her previously-established dates .
Recommendations

 -An appointment was made for her to return in 4 weeks for
 fetal growth assessment.
                 Tarla, Bambucafe

## 2021-11-02 IMAGING — US US MFM OB FOLLOW-UP
1 series · 14 of 28 positions shown · non-contrast
Comparison: none

[Series 1: us mfm ob follow-up · 35 acquisitions, 14 frames shown]
[im 2/35]
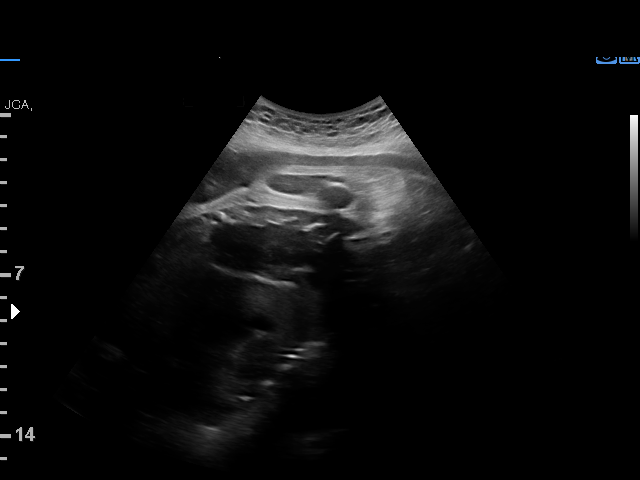
[im 4/35]
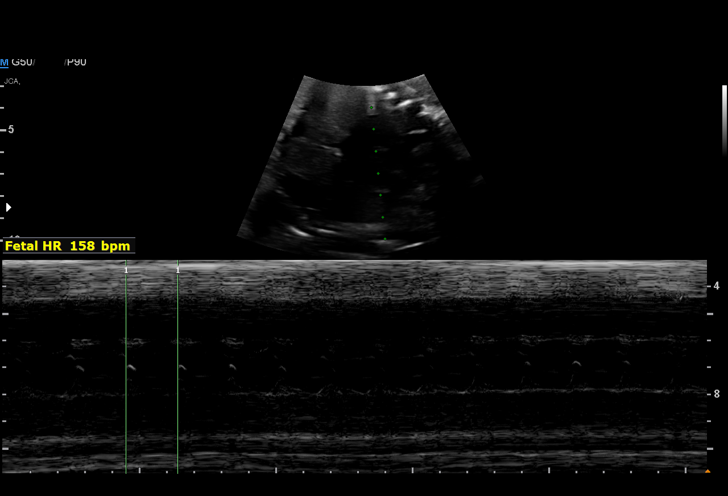
[im 7/35]
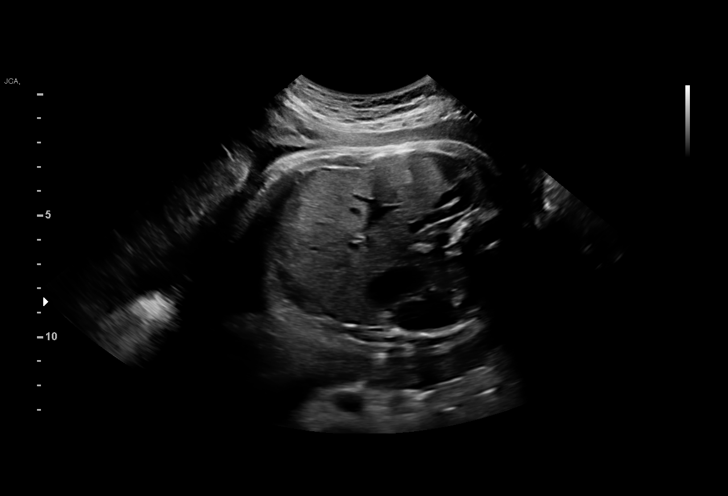
[im 9/35]
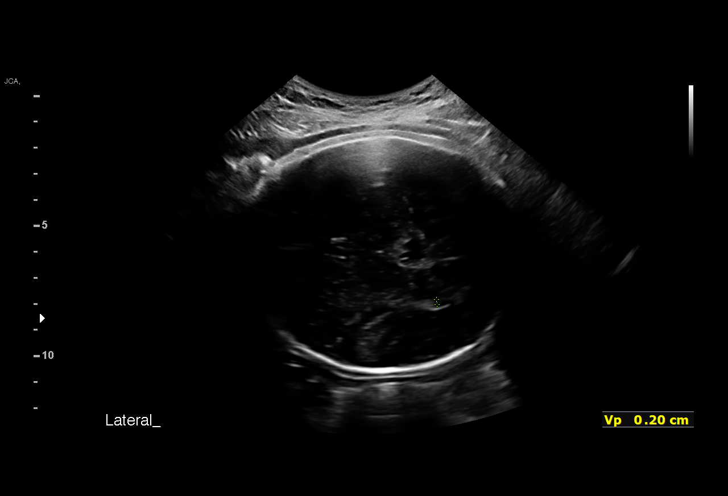
[im 12/35]
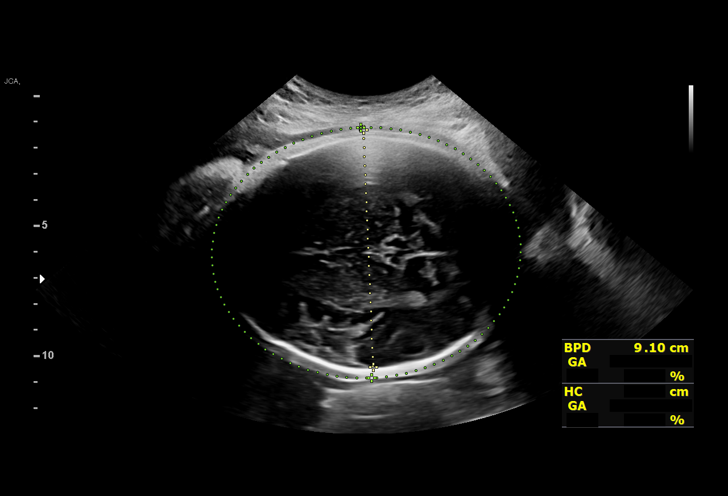
[im 14/35]
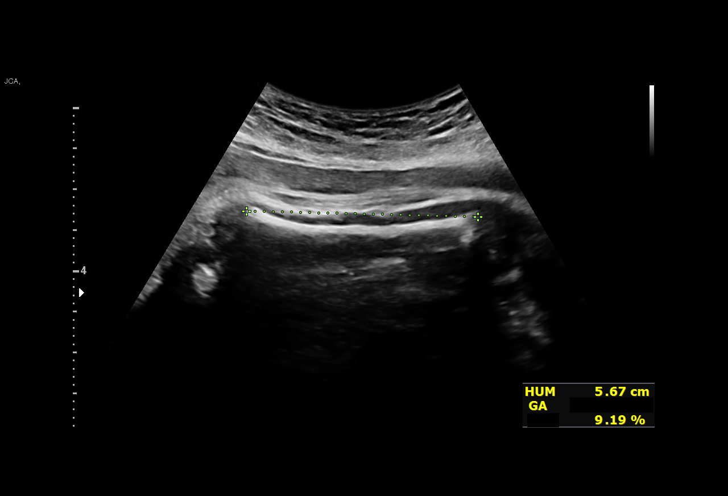
[im 17/35]
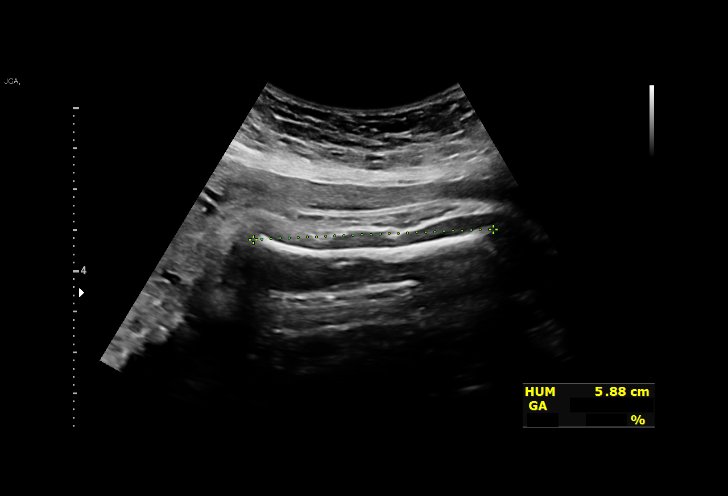
[im 19/35]
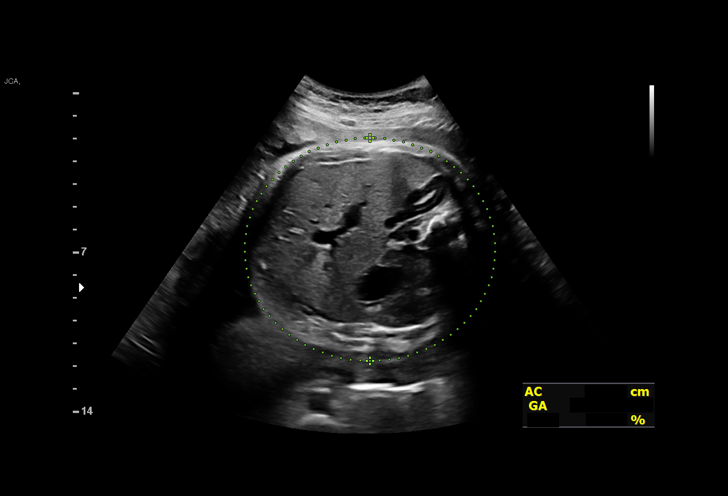
[im 22/35]
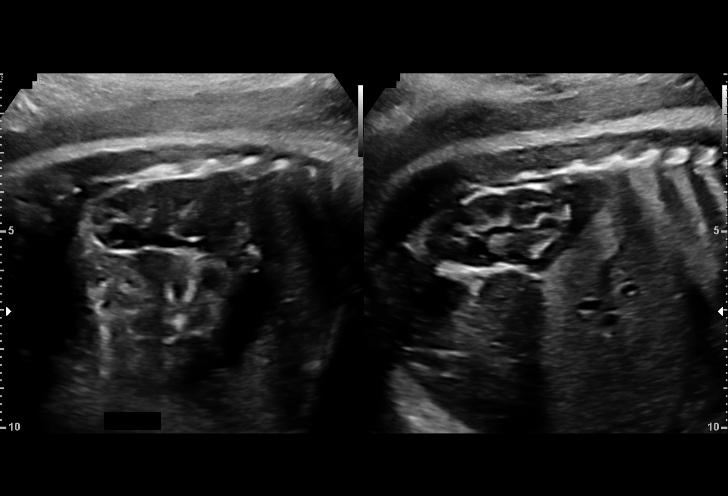
[im 24/35]
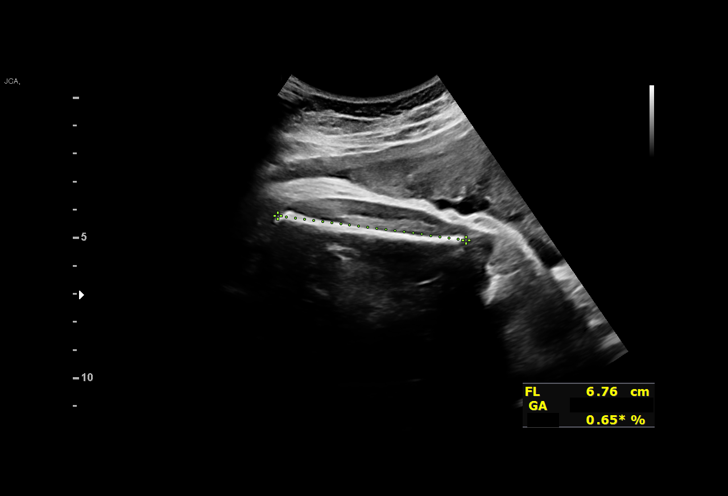
[im 27/35]
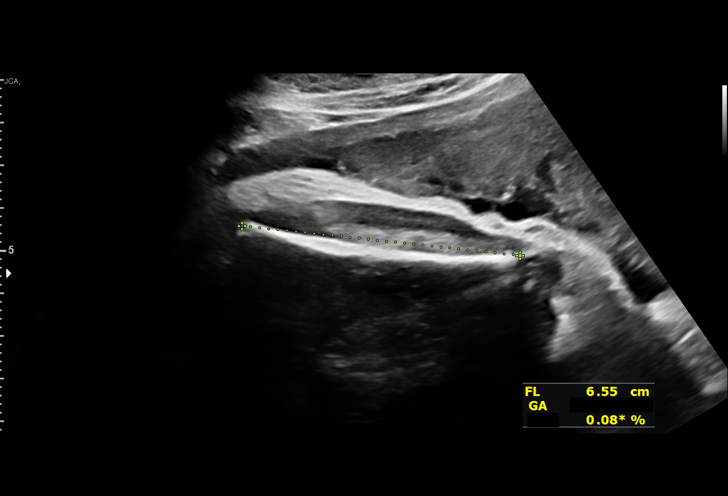
[im 29/35]
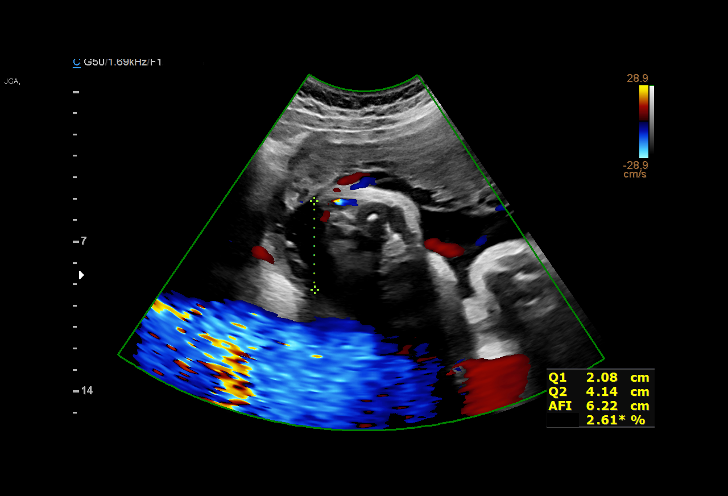
[im 32/35]
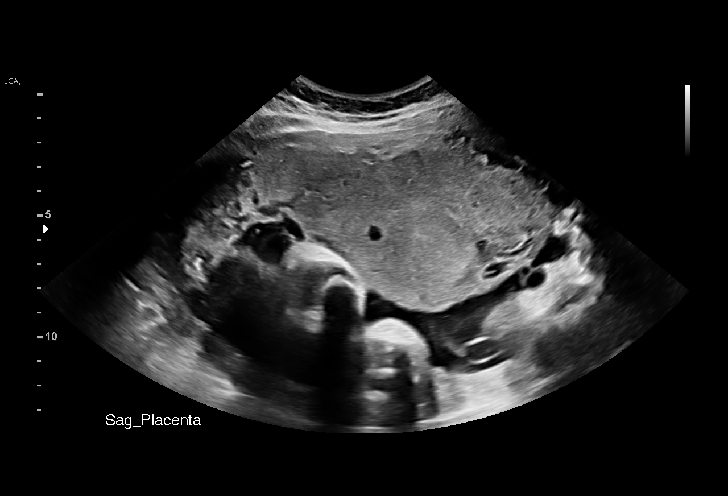
[im 35/35]
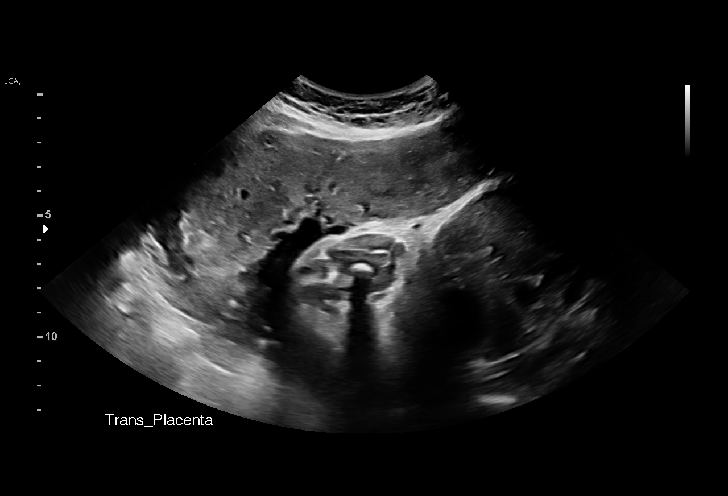

[14 of 28 positions shown; findings below may reference images not displayed]

Indications

 Gestational diabetes in pregnancy, diet
 controlled
 Genetic carrier (Lusine Jim)
 Encounter for other antenatal screening
 follow-up
 38 weeks gestation of pregnancy
Fetal Evaluation

 Num Of Fetuses:         1
 Fetal Heart Rate(bpm):  158
 Cardiac Activity:       Observed
 Presentation:           Cephalic
 Placenta:               Anterior Fundal
 P. Cord Insertion:      Visualized

 Amniotic Fluid
 AFI FV:      Subjectively low-normal

 AFI Sum(cm)     %Tile       Largest Pocket(cm)
 9.88            26

 RUQ(cm)       RLQ(cm)       LUQ(cm)        LLQ(cm)
 2.08          0
Biometry
 BPD:      90.8  mm     G. Age:  36w 6d         30  %    CI:         74.5   %    70 - 86
                                                         FL/HC:      20.1   %    20.6 -
 HC:      333.9  mm     G. Age:  38w 1d         25  %    HC/AC:      1.01        0.87 -
 AC:      329.8  mm     G. Age:  36w 6d         23  %    FL/BPD:     73.8   %    71 - 87
 FL:         67  mm     G. Age:  34w 3d        < 1  %    FL/AC:      20.3   %    20 - 24
 HUM:      58.5  mm     G. Age:  33w 6d        < 5  %
 LV:          2  mm

 Est. FW:    8438  gm      6 lb 7 oz     16  %
OB History

 Gravidity:    1
Gestational Age

 Clinical EDD:  38w 4d                                        EDD:   12/29/20
 U/S Today:     36w 4d                                        EDD:   01/12/21
 Best:          38w 4d     Det. By:  Clinical EDD             EDD:   12/29/20
Anatomy

 Cranium:               Appears normal         LVOT:                   Previously seen
 Cavum:                 Appears normal         Aortic Arch:            Previously seen
 Ventricles:            Appears normal         Ductal Arch:            Previously seen
 Choroid Plexus:        Previously seen        Diaphragm:              Appears normal
 Cerebellum:            Previously seen        Stomach:                Appears normal, left
                                                                       sided
 Posterior Fossa:       Previously seen        Abdomen:                Appears normal
 Nuchal Fold:           Not applicable (>20    Abdominal Wall:         Previously seen
                        wks GA)
 Face:                  Orbits and profile     Cord Vessels:           Previously seen
                        previously seen
 Lips:                  Previously seen        Kidneys:                Appear normal
 Palate:                Previously seen        Bladder:                Appears normal
 Thoracic:              Appears normal         Spine:                  Previously seen
 Heart:                 Appears normal         Upper Extremities:      Previously seen
                        (4CH, axis, and
                        situs)
 RVOT:                  Previously seen        Lower Extremities:      Previously seen

 Other:  VC, 3VV and 3VTV previously visualized.
Impression

 Follow up growth due to gestational diabetes (diet)
 Normal interval growth with measurements consistent with
 dates
 Good fetal movement and amniotic fluid volume

 Given Ms.Yosiaki diet controlled gestational diabetes she does
 not required testing.
Recommendations

 Follow up as clinically indicated.

## 2022-10-21 ENCOUNTER — Encounter (HOSPITAL_BASED_OUTPATIENT_CLINIC_OR_DEPARTMENT_OTHER): Payer: Self-pay

## 2022-10-21 ENCOUNTER — Emergency Department (HOSPITAL_BASED_OUTPATIENT_CLINIC_OR_DEPARTMENT_OTHER)
Admission: EM | Admit: 2022-10-21 | Discharge: 2022-10-21 | Disposition: A | Discharge status: Home / Self Care | Payer: Medicaid Other | Attending: Emergency Medicine | Admitting: Emergency Medicine

## 2022-10-21 ENCOUNTER — Other Ambulatory Visit: Payer: Self-pay

## 2022-10-21 DIAGNOSIS — S0990XA Unspecified injury of head, initial encounter: Secondary | ICD-10-CM

## 2022-10-21 NOTE — Discharge Instructions (Addendum)
If you develop new or worsening headache, vomiting, vision changes, or any other new/concerning symptoms then return to the ER or call 911.
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
## 2022-10-21 NOTE — ED Notes (Signed)
RN reviewed discharge instructions with pt. Pt verbalized understanding and had no further questions. VSS upon discharge.  
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
